# Patient Record
Sex: Female | Born: 1960 | Race: Black or African American | Hispanic: No | Marital: Single | Smoking: Current every day smoker
Health system: Southern US, Community
[De-identification: ages and names within clinical notes are randomized; demographics above are authoritative.]

## PROBLEM LIST (undated history)

## (undated) DIAGNOSIS — I10 Essential (primary) hypertension: Secondary | ICD-10-CM

## (undated) DIAGNOSIS — E785 Hyperlipidemia, unspecified: Secondary | ICD-10-CM

## (undated) HISTORY — PX: CHOLECYSTECTOMY: SHX55

## (undated) HISTORY — DX: Essential (primary) hypertension: I10

## (undated) HISTORY — DX: Hyperlipidemia, unspecified: E78.5

---

## 2008-04-02 ENCOUNTER — Emergency Department (HOSPITAL_COMMUNITY): Admission: EM | Admit: 2008-04-02 | Discharge: 2008-04-02 | Payer: Self-pay | Admitting: Emergency Medicine

## 2010-05-30 LAB — DIFFERENTIAL
Basophils Relative: 1 % (ref 0–1)
Lymphocytes Relative: 42 % (ref 12–46)
Lymphs Abs: 3 10*3/uL (ref 0.7–4.0)
Monocytes Absolute: 0.6 10*3/uL (ref 0.1–1.0)
Monocytes Relative: 9 % (ref 3–12)
Neutro Abs: 3.3 10*3/uL (ref 1.7–7.7)
Neutrophils Relative %: 46 % (ref 43–77)

## 2010-05-30 LAB — URINALYSIS, ROUTINE W REFLEX MICROSCOPIC
Glucose, UA: NEGATIVE mg/dL
Protein, ur: NEGATIVE mg/dL
Specific Gravity, Urine: 1.01 (ref 1.005–1.030)
Urobilinogen, UA: 0.2 mg/dL (ref 0.0–1.0)

## 2010-05-30 LAB — CBC
Hemoglobin: 10.5 g/dL — ABNORMAL LOW (ref 12.0–15.0)
RBC: 3.56 MIL/uL — ABNORMAL LOW (ref 3.87–5.11)
WBC: 7.2 10*3/uL (ref 4.0–10.5)

## 2010-05-30 LAB — BASIC METABOLIC PANEL
Calcium: 8.9 mg/dL (ref 8.4–10.5)
Creatinine, Ser: 0.76 mg/dL (ref 0.4–1.2)
GFR calc Af Amer: >60 mL/min (ref >60)
Sodium: 145 mEq/L (ref 135–145)

## 2010-11-24 ENCOUNTER — Encounter: Payer: Self-pay | Admitting: Family Medicine

## 2010-11-24 ENCOUNTER — Ambulatory Visit (INDEPENDENT_AMBULATORY_CARE_PROVIDER_SITE_OTHER): Payer: BC Managed Care – PPO | Admitting: Family Medicine

## 2010-11-24 VITALS — BP 140/80 | HR 90 | Resp 16 | Ht 66.0 in | Wt 254.1 lb

## 2010-11-24 DIAGNOSIS — R21 Rash and other nonspecific skin eruption: Secondary | ICD-10-CM

## 2010-11-24 DIAGNOSIS — E119 Type 2 diabetes mellitus without complications: Secondary | ICD-10-CM

## 2010-11-24 DIAGNOSIS — I10 Essential (primary) hypertension: Secondary | ICD-10-CM | POA: Insufficient documentation

## 2010-11-24 DIAGNOSIS — R252 Cramp and spasm: Secondary | ICD-10-CM

## 2010-11-24 DIAGNOSIS — IMO0002 Reserved for concepts with insufficient information to code with codable children: Secondary | ICD-10-CM | POA: Insufficient documentation

## 2010-11-24 DIAGNOSIS — E785 Hyperlipidemia, unspecified: Secondary | ICD-10-CM

## 2010-11-24 DIAGNOSIS — E1165 Type 2 diabetes mellitus with hyperglycemia: Secondary | ICD-10-CM | POA: Insufficient documentation

## 2010-11-24 DIAGNOSIS — E669 Obesity, unspecified: Secondary | ICD-10-CM

## 2010-11-24 MED ORDER — LISINOPRIL-HYDROCHLOROTHIAZIDE 10-12.5 MG PO TABS: 1.0000 | ORAL_TABLET | Freq: Every day | ORAL | Status: DC

## 2010-11-24 MED ORDER — INSULIN ASPART 100 UNIT/ML ~~LOC~~ SOLN: 5.0000 [IU] | Freq: Three times a day (TID) | SUBCUTANEOUS | Status: DC

## 2010-11-24 MED ORDER — INSULIN ASPART PROT & ASPART (70-30 MIX) 100 UNIT/ML ~~LOC~~ SUSP: SUBCUTANEOUS | Status: DC

## 2010-11-24 MED ORDER — TRIAMCINOLONE ACETONIDE 0.1 % EX OINT: TOPICAL_OINTMENT | Freq: Two times a day (BID) | CUTANEOUS | Status: DC

## 2010-11-24 NOTE — Patient Instructions (Signed)
Take 15  Units of 70/30 insulin twice a day Continue the short acting insulin at bedtime Continue your blood pressure pill Please get your lab-work done Follow-up in 2 weeks.

## 2010-11-24 NOTE — Progress Notes (Signed)
  Subjective:    Patient ID: Erin Lambert, female    DOB: 1960/05/05, 50 y.o.   MRN: [id number]  HPI pt has not seen a primary doctor  1. Diabetes Mellitus- diagnosed 2 years ago, has not seen a PCP in 4 years, when diagnosed she was found in [place] and taken to [facility] hospital- stay was approx 5 days, started on insulin then. Since then has been to urgent cares to have insulin refilled. Has not had an A1C, lipid  Last seen at urgent care 6 weeks ago. She does not take her insulin on a regular basis. Does not have the best diet CBG fasting- 180-200's, no hypoglycemic episodes, states her CBG have worsened over past few months Works night shift  2. Has a rash on arms/legs as the season changes, this has happened since a child, she has used calamine before which dries this up, +pruritis    3. HTN- has been on HCTZ/lisinopril for 2 years, refills have come from urgent care. States at home systolic is low 100's, diastolic 95  4. ? Hyperlipidemia- no meds , told to exercise and diet in the past  Lives in [place] for Mammogram, PAP Smear Declines Flu shot  Review of Systems   GEN- denies fatigue, fever, weight loss,weakness, recent illness HEENT- denies eye drainage, change in vision, nasal discharge, CVS- denies chest pain, palpitations RESP- denies SOB, cough, wheeze ABD- denies N/V, change in stools, abd pain GU- denies dysuria, hematuria, dribbling, incontinence MSK- denies joint pain, muscle aches, injury, +leg cramps Neuro- denies headache, dizziness, syncope, seizure activity      Objective:   Physical Exam   GEN- NAD, alert and oriented x3 HEENT- PERRL, EOMI, non injected sclera, pink conjunctiva, MMM, oropharynx clear Neck- Supple, no thryomegaly CVS- RRR, no murmur RESP-CTAB EXT- No edema Pulses- Radial, DP- 2+ Skin- hyperpigamented macules- well healed on lower ext and bilat arms , erythematous slightly raised lesion on inner part of left  wrist       Assessment & Plan:

## 2010-11-26 ENCOUNTER — Encounter: Payer: Self-pay | Admitting: Family Medicine

## 2010-11-26 DIAGNOSIS — R21 Rash and other nonspecific skin eruption: Secondary | ICD-10-CM | POA: Insufficient documentation

## 2010-11-26 NOTE — Assessment & Plan Note (Signed)
Check A1C F/U 1 week to determine her diabetes regimine, explained how 70/30 mix should be taken twice a day

## 2010-11-26 NOTE — Assessment & Plan Note (Signed)
Check lytes, encourage fluids

## 2010-11-26 NOTE — Assessment & Plan Note (Signed)
Check lipid panel  

## 2010-11-26 NOTE — Assessment & Plan Note (Signed)
dermatitis of some sort, only has 1 active lesion now, will give trial of topical steroid

## 2010-11-26 NOTE — Assessment & Plan Note (Signed)
Continue current medication, check labs, goal < 130/80

## 2010-11-28 LAB — COMPREHENSIVE METABOLIC PANEL
ALT: 20 U/L (ref 0–35)
Albumin: 3.9 g/dL (ref 3.5–5.2)
CO2: 31 mEq/L (ref 19–32)
Chloride: 103 mEq/L (ref 96–112)
Potassium: 3.9 mEq/L (ref 3.5–5.3)
Sodium: 142 mEq/L (ref 135–145)
Total Bilirubin: 0.4 mg/dL (ref 0.3–1.2)
Total Protein: 6.9 g/dL (ref 6.0–8.3)

## 2010-11-28 LAB — CBC
HCT: 41.1 % (ref 36.0–46.0)
MCHC: 32.1 g/dL (ref 30.0–36.0)
Platelets: 263 10*3/uL (ref 150–400)
RDW: 13.4 % (ref 11.5–15.5)

## 2010-11-28 LAB — HEMOGLOBIN A1C: Mean Plasma Glucose: 292 mg/dL — ABNORMAL HIGH (ref <117)

## 2010-11-28 LAB — TSH: TSH: 6.005 u[IU]/mL — ABNORMAL HIGH (ref 0.350–4.500)

## 2010-11-28 LAB — LIPID PANEL
LDL Cholesterol: 140 mg/dL — ABNORMAL HIGH (ref 0–99)
Triglycerides: 74 mg/dL (ref <150)

## 2010-11-29 ENCOUNTER — Telehealth: Payer: Self-pay | Admitting: *Deleted

## 2010-11-29 ENCOUNTER — Other Ambulatory Visit: Payer: Self-pay | Admitting: Family Medicine

## 2010-11-29 LAB — T4, FREE: Free T4: 1.12 ng/dL (ref 0.80–1.80)

## 2010-11-29 NOTE — Telephone Encounter (Signed)
Labs added Called patient, left message

## 2010-11-29 NOTE — Telephone Encounter (Signed)
Message copied by Diamantina Monks on Wed Nov 29, 2010  9:59 AM ------      Message from: Milinda Antis F      Created: Tue Nov 28, 2010  1:55 PM       Please call the lab and add on a Free T4 and T3, she had a red tube drawn- reason hypothyroidism.      Also please call pt and tell her - the A1C was very high at 11.8%, we will discuss in detail at our next visit.      I want her to inject 20 units of 70/30 in the AM and 15 units at night. Please bring her meter or record or CBG to the next visit.

## 2010-11-29 NOTE — Telephone Encounter (Signed)
Message copied by Diamantina Monks on Wed Nov 29, 2010  9:45 AM ------      Message from: Milinda Antis F      Created: Tue Nov 28, 2010  1:55 PM       Please call the lab and add on a Free T4 and T3, she had a red tube drawn- reason hypothyroidism.      Also please call pt and tell her - the A1C was very high at 11.8%, we will discuss in detail at our next visit.      I want her to inject 20 units of 70/30 in the AM and 15 units at night. Please bring her meter or record or CBG to the next visit.

## 2010-11-29 NOTE — Telephone Encounter (Signed)
Patient aware of lab results.

## 2010-12-08 ENCOUNTER — Encounter: Payer: Self-pay | Admitting: Family Medicine

## 2010-12-08 ENCOUNTER — Ambulatory Visit (INDEPENDENT_AMBULATORY_CARE_PROVIDER_SITE_OTHER): Payer: BC Managed Care – PPO | Admitting: Family Medicine

## 2010-12-08 VITALS — BP 120/80 | HR 74 | Resp 16 | Ht 66.0 in | Wt 255.1 lb

## 2010-12-08 DIAGNOSIS — E119 Type 2 diabetes mellitus without complications: Secondary | ICD-10-CM

## 2010-12-08 DIAGNOSIS — E785 Hyperlipidemia, unspecified: Secondary | ICD-10-CM

## 2010-12-08 DIAGNOSIS — R252 Cramp and spasm: Secondary | ICD-10-CM

## 2010-12-08 DIAGNOSIS — I10 Essential (primary) hypertension: Secondary | ICD-10-CM

## 2010-12-08 MED ORDER — INSULIN GLARGINE 100 UNIT/ML ~~LOC~~ SOLN: SUBCUTANEOUS | Status: DC

## 2010-12-08 MED ORDER — CYCLOBENZAPRINE HCL 5 MG PO TABS: 5.0000 mg | ORAL_TABLET | Freq: Every evening | ORAL | Status: DC | PRN

## 2010-12-08 MED ORDER — SIMVASTATIN 20 MG PO TABS: 20.0000 mg | ORAL_TABLET | Freq: Every day | ORAL | Status: DC

## 2010-12-08 NOTE — Patient Instructions (Addendum)
Start the Lantus, take 20 units with your lunch meal- this is once day  Continue the 5 units with each meal of Novolog Continue the blood pressure pill Pick up Calicum(1200mg ) /Vitamin D (800IU) Keep hydrated  Take the muscle relaxant at bedtime as needed  Start the cholesterol pill - take the in the evening Start an exercise program- Next routine visit in 3 months

## 2010-12-08 NOTE — Progress Notes (Signed)
  Subjective:    Patient ID: Erin Lambert, female    DOB: 1960-12-14, 50 y.o.   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 - 6AM 90-200's during this time Take CBG around between 2-3pm 109-229, often only gives 1 shot in 24 hours  Gives herself the 1st shot between 2 and 4pm,   Shift is 5pm-5pm Eats dinner at work at [employer] herself 5 units of Novolog at 8pm with dinner (misses this dose a lot), 6am gives 5 units, 2pm with Lunch   Stomach swelling- gets heartburn, feels one side is larger than the other  Gets knots in legs in calf- goes to chiropracter, also has cramping at night, normal Lytes  HTN- blood pressure improved, tolerating meds  Labs reviewed  Review of Systems GEN- denies fatigue, fever, weight loss,weakness, recent illness CVS- denies chest pain, palpitations RESP- denies SOB, cough, wheeze ABD- denies N/V, change in stools, abd pain GU- denies dysuria, hematuria, dribbling, incontinence MSK- denies joint pain, +muscle aches,denies injury, +leg cramps    Objective:   Physical Exam GEN- NAD, alert and oriented x 3 CVS- RRR., no murmur RESP-CTAB ABD- NABS, soft, NT,ND, large pannus, no asymetry seen EXT- no lesions felt in legs,  No edema      Assessment & Plan:

## 2010-12-10 NOTE — Assessment & Plan Note (Signed)
Bp improved, continue current med

## 2010-12-10 NOTE — Assessment & Plan Note (Signed)
Muscle relaxant, pt also sees chiropracter which helps, I do not see any of the "knots" she is describing, hydration

## 2010-12-10 NOTE — Assessment & Plan Note (Signed)
Her diabetes will be very difficult to control based on her erractic schedule. She has not been compliant in the past. She often skips insulin if she feels her sugar is low. As she is not eating on a regular basis and does not want insulin at work.  Will switch to Lantus and continue short acting insulin She declines nutritionist

## 2010-12-10 NOTE — Assessment & Plan Note (Signed)
Start zocor

## 2011-03-02 ENCOUNTER — Ambulatory Visit: Payer: BC Managed Care – PPO | Admitting: Family Medicine

## 2011-03-08 ENCOUNTER — Ambulatory Visit: Payer: BC Managed Care – PPO | Admitting: Family Medicine

## 2011-04-03 ENCOUNTER — Encounter (HOSPITAL_COMMUNITY): Payer: Self-pay | Admitting: *Deleted

## 2011-04-03 ENCOUNTER — Emergency Department (HOSPITAL_COMMUNITY)
Admission: EM | Admit: 2011-04-03 | Discharge: 2011-04-03 | Disposition: A | Discharge status: Home / Self Care | Payer: BC Managed Care – PPO | Attending: Emergency Medicine | Admitting: Emergency Medicine

## 2011-04-03 ENCOUNTER — Emergency Department (HOSPITAL_COMMUNITY): Payer: BC Managed Care – PPO

## 2011-04-03 DIAGNOSIS — Z9889 Other specified postprocedural states: Secondary | ICD-10-CM | POA: Insufficient documentation

## 2011-04-03 DIAGNOSIS — Z87891 Personal history of nicotine dependence: Secondary | ICD-10-CM | POA: Insufficient documentation

## 2011-04-03 DIAGNOSIS — M549 Dorsalgia, unspecified: Secondary | ICD-10-CM | POA: Insufficient documentation

## 2011-04-03 DIAGNOSIS — E119 Type 2 diabetes mellitus without complications: Secondary | ICD-10-CM | POA: Insufficient documentation

## 2011-04-03 DIAGNOSIS — M25569 Pain in unspecified knee: Secondary | ICD-10-CM | POA: Insufficient documentation

## 2011-04-03 DIAGNOSIS — Z794 Long term (current) use of insulin: Secondary | ICD-10-CM | POA: Insufficient documentation

## 2011-04-03 DIAGNOSIS — X500XXA Overexertion from strenuous movement or load, initial encounter: Secondary | ICD-10-CM | POA: Insufficient documentation

## 2011-04-03 DIAGNOSIS — I10 Essential (primary) hypertension: Secondary | ICD-10-CM | POA: Insufficient documentation

## 2011-04-03 MED ORDER — KETOROLAC TROMETHAMINE 60 MG/2ML IM SOLN
60.0000 mg | Freq: Once | INTRAMUSCULAR | Status: AC
  Administered 2011-04-03: 60 mg via INTRAMUSCULAR
  Filled 2011-04-03: qty 2

## 2011-04-03 MED ORDER — HYDROCODONE-ACETAMINOPHEN 5-325 MG PO TABS: ORAL_TABLET | ORAL | Status: AC

## 2011-04-03 MED ORDER — NAPROXEN 500 MG PO TABS: 500.0000 mg | ORAL_TABLET | Freq: Two times a day (BID) | ORAL | Status: DC

## 2011-04-03 NOTE — ED Notes (Signed)
Pain rt leg since "popped" my knee .

## 2011-04-03 NOTE — ED Notes (Signed)
Pt states that she was at work a few weeks ago when she turned suddenly and heard her right knee "pop", pt has pain that extends from the thigh area down right knee and into right calf area, pt ambulatory with limp, pt also c/o lower back pain, pt states that she has been having problems with her calf and lower back and has been seeing a chiropractor but states that it has gotten worse since the injury a few weeks ago, cms intact all extremities

## 2011-04-03 NOTE — Discharge Instructions (Signed)
Knee Pain °Knee pain can be a result of an injury or other medical conditions. Treatment will depend on the cause of your pain. °HOME CARE °· Only take medicine as told by your doctor.  °· Keep a healthy weight. Being overweight can make the knee hurt more.  °· Stretch before exercising or playing sports.  °· If there is constant knee pain, change the way you exercise. Ask your doctor for advice.  °· Make sure shoes fit well. Choose the right shoe for the sport or activity.  °· Protect your knees. Wear kneepads if needed.  °· Rest when you are tired.  °GET HELP RIGHT AWAY IF:  °· Your knee pain does not stop.  °· Your knee pain does not get better.  °· Your knee joint feels hot to the touch.  °· You have a temperature by mouth above 102° F (38.9° C), not controlled by medicine.  °· Your baby is older than 3 months with a rectal temperature of 102° F (38.9° C) or higher.  °· Your baby is 3 months old or younger with a rectal temperature of 100.4° F (38° C) or higher.  °MAKE SURE YOU:  °· Understand these instructions.  °· Will watch this condition.  °· Will get help right away if you are not doing well or get worse.  °Document Released: 04/27/2008 Document Revised: 10/11/2010 Document Reviewed: 04/27/2008 °Exit[employer]® Patient Information ©2012 [employer]. °

## 2011-04-03 NOTE — ED Provider Notes (Signed)
History     CSN: [id number]  Arrival date & time 04/03/11  1346   First MD Initiated Contact with Patient 04/03/11 1522      Chief Complaint  Patient presents with  . Leg Pain    (Consider location/radiation/quality/duration/timing/severity/associated sxs/prior treatment) HPI Comments: Patient c/o persistent pain to the right knee for several weeks since she twisted her knee at work.  Stats she felt a "pop"  At onset. PAin is worse with walking and improves somewhat with rest.  She also c/o pain to her right lower back that radiates into her lateral thigh and right lower leg.  Describes as sharp and "shooting" pain.  She denies incontinence, numbness or dysuria.  States she has been seeing a Land for same   Patient is a 51 y.o. female presenting with knee pain and back pain. The history is provided by the patient. No language interpreter was used.  Knee Pain This is a new problem. The current episode started 1 to 4 weeks ago. The problem occurs constantly. The problem has been unchanged. Associated symptoms include arthralgias. Pertinent negatives include no abdominal pain, chest pain, fever, headaches, joint swelling, neck pain, numbness, vomiting or weakness. The symptoms are aggravated by standing, twisting and walking. She has tried NSAIDs for the symptoms. The treatment provided no relief.  Back Pain  This is a recurrent problem. The current episode started more than 1 week ago. The problem occurs constantly. The problem has not changed since onset.The pain is associated with twisting. The pain is present in the lumbar spine. The quality of the pain is described as aching and shooting. The pain radiates to the right thigh. The pain is moderate. The symptoms are aggravated by bending, twisting and certain positions. The pain is the same all the time. Associated symptoms include leg pain. Pertinent negatives include no chest pain, no fever, no numbness, no headaches, no abdominal  pain, no abdominal swelling, no bowel incontinence, no perianal numbness, no bladder incontinence, no dysuria, no pelvic pain, no paresthesias, no paresis, no tingling and no weakness. She has tried NSAIDs for the symptoms. The treatment provided mild relief.    Past Medical History  Diagnosis Date  . Diabetes mellitus   . Hypertension     Past Surgical History  Procedure Date  . Cholecystectomy     Family History  Problem Relation Age of Onset  . Hypertension Mother   . Heart disease Mother   . Diabetes Father   . Kidney disease Father   . Heart disease Father   . Diabetes Brother     History  Substance Use Topics  . Smoking status: Former Smoker -- 1.0 packs/day for 25 years  . Smokeless tobacco: Not on file  . Alcohol Use: Yes     occasionally    OB History    Grav Para Term Preterm Abortions TAB SAB Ect Mult Living                  Review of Systems  Constitutional: Negative for fever, activity change and appetite change.  HENT: Negative for neck pain.   Respiratory: Negative for chest tightness and shortness of breath.   Cardiovascular: Negative for chest pain.  Gastrointestinal: Negative for vomiting, abdominal pain and bowel incontinence.  Genitourinary: Negative for bladder incontinence, dysuria, hematuria, decreased urine volume, difficulty urinating and pelvic pain.  Musculoskeletal: Positive for back pain and arthralgias. Negative for joint swelling.  Skin: Negative.   Neurological: Negative for dizziness, tingling, weakness,  numbness, headaches and paresthesias.  All other systems reviewed and are negative.    Allergies  Mushroom ext cmplx(shiitake-reishi-mait)  Home Medications   Current Outpatient Rx  Name Route Sig Dispense Refill  . CRANBERRY PO Oral Take 1 capsule by mouth daily.    . CYCLOBENZAPRINE HCL 5 MG PO TABS Oral Take 1 tablet (5 mg total) by mouth at bedtime as needed. 30 tablet 2  . OMEGA-3 FATTY ACIDS 1000 MG PO CAPS Oral Take  2 g by mouth daily.    . INSULIN ASPART 100 UNIT/ML [place] SOLN Subcutaneous Inject 5 Units into the skin 3 (three) times daily before meals. 10 mL 3  . INSULIN GLARGINE 100 UNIT/ML [place] SOLN  Inject 20 units daily 10 mL 6    Please dispense pen needles  . LISINOPRIL-HYDROCHLOROTHIAZIDE 10-12.5 MG PO TABS Oral Take 1 tablet by mouth daily. 30 tablet 3  . ADULT MULTIVITAMIN W/MINERALS CH Oral Take 1 tablet by mouth daily.    Marland Kitchen OLIVE LEAF PO Oral Take 1 capsule by mouth 3 (three) times daily with meals.    Marland Kitchen SIMVASTATIN 20 MG PO TABS Oral Take 20 mg by mouth every morning.      BP 158/84  Pulse 80  Temp(Src) 98.7 F (37.1 C) (Oral)  Resp 18  Ht 5\' 6"  (1.676 m)  Wt 250 lb (113.399 kg)  BMI 40.35 kg/m2  SpO2 100%  Physical Exam  Nursing note and vitals reviewed. Constitutional: She is oriented to person, place, and time. She appears well-developed and well-nourished. No distress.  HENT:  Head: Normocephalic and atraumatic.  Neck: Normal range of motion. Neck supple.  Cardiovascular: Normal rate, regular rhythm, normal heart sounds and intact distal pulses.   No murmur heard. Pulmonary/Chest: Effort normal and breath sounds normal. No respiratory distress.  Musculoskeletal: Normal range of motion. She exhibits tenderness. She exhibits no edema.       Right knee: She exhibits bony tenderness. She exhibits normal range of motion, no swelling, no effusion, no ecchymosis, no deformity, no erythema, no LCL laxity, normal patellar mobility and no MCL laxity. tenderness found. Lateral joint line tenderness noted. No patellar tendon tenderness noted.       Lumbar back: She exhibits tenderness. She exhibits normal range of motion, no bony tenderness, no swelling, no edema, no deformity, no laceration, no spasm and normal pulse.       Back:       Legs: Neurological: She is alert and oriented to person, place, and time. No cranial nerve deficit or sensory deficit. She exhibits normal muscle tone.  Coordination normal.  Reflex Scores:      Patellar reflexes are 2+ on the right side and 2+ on the left side.      Achilles reflexes are 2+ on the right side and 2+ on the left side. Skin: Skin is warm and dry.    ED Course  Procedures (including critical care time)  Labs Reviewed - No data to display Dg Knee Complete 4 Views Right  04/03/2011  *RADIOLOGY REPORT*  Clinical Data: Lateral right knee pain, crepitus/popping sensation approximately 2 weeks ago.  RIGHT KNEE - COMPLETE 4+ VIEW 04/03/2011:  Comparison: None.  Findings: No evidence of acute or subacute fracture or dislocation. Moderate medial compartment joint space narrowing with associated mild hypertrophic spurring.  Patellofemoral and lateral compartment joint spaces well preserved.  Minimal spurring along the undersurface of the patella.  Bone mineral density well preserved. Probable small joint effusion.  IMPRESSION: Moderate  medial compartment osteoarthritis and mild degenerative changes involving the undersurface of the patella.  No acute or subacute osseous abnormality.  Probable small joint effusion.  Original Report Authenticated By: Arnell Sieving, M.D.        Knee immob was applied by the nursing staff.  Pain improved , remains NV intact.    MDM    Patient has tenderness to palpation of the lateral right knee mild crepitus is present on exam. She has full range of motion of the knee.  No erythema, effusion, obvious ligament instability or step-offs are present on exam.  No calf pain, edema, or erythema.  She agrees to close followup with an orthopedist.      Pt feels improved after observation and/or treatment in ED. Patient / Family / Caregiver understand and agree with initial ED impression and plan with expectations set for ED visit. Pt stable in ED with no significant deterioration in condition.     Cherine Drumgoole L. Keymani Mclean, [place] 04/05/11 2050

## 2011-04-05 NOTE — ED Provider Notes (Signed)
Medical screening examination/treatment/procedure(s) were performed by non-physician practitioner and as supervising physician I was immediately available for consultation/collaboration.   Chezney Huether, MD 04/05/11 2356 

## 2011-05-30 ENCOUNTER — Other Ambulatory Visit: Payer: Self-pay | Admitting: Family Medicine

## 2011-06-17 ENCOUNTER — Other Ambulatory Visit: Payer: Self-pay | Admitting: Family Medicine

## 2011-11-29 ENCOUNTER — Ambulatory Visit (INDEPENDENT_AMBULATORY_CARE_PROVIDER_SITE_OTHER): Payer: BC Managed Care – PPO | Admitting: Family Medicine

## 2011-11-29 ENCOUNTER — Encounter: Payer: Self-pay | Admitting: Family Medicine

## 2011-11-29 VITALS — BP 150/98 | HR 80 | Resp 15 | Ht 66.0 in | Wt 270.1 lb

## 2011-11-29 DIAGNOSIS — IMO0001 Reserved for inherently not codable concepts without codable children: Secondary | ICD-10-CM

## 2011-11-29 DIAGNOSIS — E669 Obesity, unspecified: Secondary | ICD-10-CM

## 2011-11-29 DIAGNOSIS — E119 Type 2 diabetes mellitus without complications: Secondary | ICD-10-CM

## 2011-11-29 DIAGNOSIS — M549 Dorsalgia, unspecified: Secondary | ICD-10-CM

## 2011-11-29 DIAGNOSIS — E1165 Type 2 diabetes mellitus with hyperglycemia: Secondary | ICD-10-CM

## 2011-11-29 DIAGNOSIS — I10 Essential (primary) hypertension: Secondary | ICD-10-CM

## 2011-11-29 DIAGNOSIS — E785 Hyperlipidemia, unspecified: Secondary | ICD-10-CM

## 2011-11-29 DIAGNOSIS — IMO0002 Reserved for concepts with insufficient information to code with codable children: Secondary | ICD-10-CM

## 2011-11-29 MED ORDER — METFORMIN HCL ER 500 MG PO TB24: 500.0000 mg | ORAL_TABLET | Freq: Every day | ORAL | Status: DC

## 2011-11-29 MED ORDER — LISINOPRIL-HYDROCHLOROTHIAZIDE 10-12.5 MG PO TABS: 1.0000 | ORAL_TABLET | Freq: Every day | ORAL | Status: DC

## 2011-11-29 MED ORDER — NAPROXEN 500 MG PO TABS: 500.0000 mg | ORAL_TABLET | Freq: Two times a day (BID) | ORAL | Status: DC

## 2011-11-29 NOTE — Patient Instructions (Addendum)
Start lantus 20 units  Novolog with meals 5 units Metformin once a day Labs to be done Monday Naprosyn for back pain F/U 2 weeks

## 2011-11-29 NOTE — Progress Notes (Signed)
  Subjective:    Patient ID: Vidal Schwalbe, female    DOB: [birth year], 51 y.o.   MRN: [id number]  HPI  Pt here to f/u chronic medical problems she has not been seen in 1 year, she has not been taking medications on regular basis, she uses Novolog 5-10 units with meals, but not using Lantus. She is gaining 20 pounds since our last visit. She states that she is ready to change and take care of herself and has return for treatment. She also complains of chronic back pain worse in her lower back. She denies any radiation or change in bowel or bladder Review of Systems  GEN- denies fatigue, fever, weight loss,weakness, recent illness HEENT- denies eye drainage, change in vision, nasal discharge, CVS- denies chest pain, palpitations RESP- denies SOB, cough, wheeze ABD- denies N/V, change in stools, abd pain GU- denies dysuria, hematuria, dribbling, incontinence MSK- + joint pain, muscle aches, injury Neuro- denies headache, dizziness, syncope, seizure activity      Objective:   Physical Exam GEN- NAD, alert and oriented x3, obese HEENT- PERRL, EOMI, non injected sclera, pink conjunctiva, MMM, oropharynx clear Neck- Supple,  CVS- RRR, no murmur RESP-CTAB EXT- No edema Pulses- Radial, DP- 2+ Feet- Callus bilateral, thickened toenails, no open lesions       Assessment & Plan:

## 2011-12-02 DIAGNOSIS — M549 Dorsalgia, unspecified: Secondary | ICD-10-CM | POA: Insufficient documentation

## 2011-12-02 NOTE — Assessment & Plan Note (Signed)
Lantus 20 units, novolog 5 units QAC, obtain Fasting labs and A1C, on ACEI

## 2011-12-02 NOTE — Assessment & Plan Note (Signed)
Pt gaining weight, discussed diet and exercise

## 2011-12-02 NOTE — Assessment & Plan Note (Signed)
Not taking statin drug, check FLP then decide on dose of medication

## 2011-12-02 NOTE — Assessment & Plan Note (Signed)
Restart BP medications, check labs

## 2011-12-02 NOTE — Assessment & Plan Note (Signed)
MSK pain, NSAIDS given

## 2011-12-03 LAB — CBC
HCT: 42.3 % (ref 36.0–46.0)
Hemoglobin: 14 g/dL (ref 12.0–15.0)
WBC: 7.4 10*3/uL (ref 4.0–10.5)

## 2011-12-04 LAB — COMPREHENSIVE METABOLIC PANEL
BUN: 10 mg/dL (ref 6–23)
CO2: 30 mEq/L (ref 19–32)
Calcium: 9.5 mg/dL (ref 8.4–10.5)
Chloride: 106 mEq/L (ref 96–112)
Creat: 0.73 mg/dL (ref 0.50–1.10)

## 2011-12-04 LAB — LIPID PANEL
Cholesterol: 171 mg/dL (ref 0–200)
Triglycerides: 90 mg/dL (ref <150)
VLDL: 18 mg/dL (ref 0–40)

## 2011-12-04 LAB — HEMOGLOBIN A1C
Hgb A1c MFr Bld: 7 % — ABNORMAL HIGH (ref <5.7)
Mean Plasma Glucose: 154 mg/dL — ABNORMAL HIGH (ref <117)

## 2011-12-12 ENCOUNTER — Other Ambulatory Visit: Payer: Self-pay

## 2011-12-12 ENCOUNTER — Telehealth: Payer: Self-pay | Admitting: Family Medicine

## 2011-12-12 MED ORDER — INSULIN GLARGINE 100 UNIT/ML ~~LOC~~ SOLN: SUBCUTANEOUS | Status: DC

## 2011-12-12 NOTE — Telephone Encounter (Signed)
Med refilled.

## 2011-12-13 ENCOUNTER — Ambulatory Visit: Payer: BC Managed Care – PPO | Admitting: Family Medicine

## 2011-12-27 ENCOUNTER — Ambulatory Visit: Payer: BC Managed Care – PPO | Admitting: Family Medicine

## 2012-01-01 ENCOUNTER — Ambulatory Visit: Payer: BC Managed Care – PPO | Admitting: Family Medicine

## 2012-01-01 ENCOUNTER — Encounter: Payer: Self-pay | Admitting: Family Medicine

## 2012-01-28 ENCOUNTER — Telehealth: Payer: Self-pay | Admitting: Family Medicine

## 2012-01-28 ENCOUNTER — Other Ambulatory Visit: Payer: Self-pay | Admitting: Family Medicine

## 2012-01-28 NOTE — Telephone Encounter (Signed)
This was sent in today.  

## 2012-02-01 ENCOUNTER — Ambulatory Visit: Payer: BC Managed Care – PPO | Admitting: Family Medicine

## 2012-07-30 ENCOUNTER — Other Ambulatory Visit: Payer: Self-pay | Admitting: Family Medicine

## 2012-07-30 NOTE — Telephone Encounter (Signed)
Med refilled and pt aware needs ov

## 2012-08-08 ENCOUNTER — Ambulatory Visit: Payer: Self-pay | Admitting: Family Medicine

## 2012-10-24 ENCOUNTER — Ambulatory Visit (INDEPENDENT_AMBULATORY_CARE_PROVIDER_SITE_OTHER): Payer: BC Managed Care – PPO | Admitting: Family Medicine

## 2012-10-24 VITALS — BP 120/62 | HR 70 | Temp 97.5°F | Resp 18 | Wt 261.0 lb

## 2012-10-24 DIAGNOSIS — M25571 Pain in right ankle and joints of right foot: Secondary | ICD-10-CM

## 2012-10-24 DIAGNOSIS — M25579 Pain in unspecified ankle and joints of unspecified foot: Secondary | ICD-10-CM

## 2012-10-24 DIAGNOSIS — E785 Hyperlipidemia, unspecified: Secondary | ICD-10-CM

## 2012-10-24 DIAGNOSIS — E669 Obesity, unspecified: Secondary | ICD-10-CM

## 2012-10-24 DIAGNOSIS — I10 Essential (primary) hypertension: Secondary | ICD-10-CM

## 2012-10-24 DIAGNOSIS — E119 Type 2 diabetes mellitus without complications: Secondary | ICD-10-CM

## 2012-10-24 MED ORDER — METFORMIN HCL 1000 MG PO TABS: 1000.0000 mg | ORAL_TABLET | Freq: Two times a day (BID) | ORAL | Status: DC

## 2012-10-24 MED ORDER — NAPROXEN 500 MG PO TABS: 500.0000 mg | ORAL_TABLET | Freq: Two times a day (BID) | ORAL | Status: DC

## 2012-10-24 MED ORDER — LISINOPRIL-HYDROCHLOROTHIAZIDE 10-12.5 MG PO TABS: 1.0000 | ORAL_TABLET | Freq: Every day | ORAL | Status: DC

## 2012-10-24 NOTE — Patient Instructions (Addendum)
1/2 tablet at dinner for 3 days, then 1/2 tablet twice a day x 2 weeks Then increase to 1 tablet twice a day  Continue blood pressure medication I will about the blood work and what dose of simvastatin Keep checking blood sugar before meals  Stop the insulin  Take naprosyn twice a day for foot as needed F/U 6 weeks

## 2012-10-24 NOTE — Progress Notes (Signed)
  Subjective:    Patient ID: Erin Lambert, female    DOB: 13-Dec-1960, 52 y.o.   MRN: [id number]  HPI  Patient here to follow chronic medical problems. She's not been seen for approximately 11 months. She's been lost to followup. She has a history of diabetes mellitus which is been uncontrolled. She actually stopped her Lantus greater than 6 months ago because she did not think it was helping her blood sugars. She has also been out of her metformin and her cholesterol medication. The only medication she has been taking on a regular basis his NovoLog which she will sometimes give herself 5-10 units with her meals as well as her blood pressure pill. Her blood sugars have been running 150 to 180s when she does take them.  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 She states it feels similar to her previous tendinitis. She denies any particular injury.  Declines flu shot and pneumonia vaccine  Review of Systems  GEN- denies fatigue, fever, weight loss,weakness, recent illness HEENT- denies eye drainage, change in vision, nasal discharge, CVS- denies chest pain, palpitations RESP- denies SOB, cough, wheeze ABD- denies N/V, change in stools, abd pain GU- denies dysuria, hematuria, dribbling, incontinence MSK- + joint pain, muscle aches, injury Neuro- denies headache, dizziness, syncope, seizure activity      Objective:   Physical Exam GEN- NAD, alert and oriented x3, obese HEENT- PERRL, EOMI, non injected sclera, pink conjunctiva, MMM, oropharynx clear Neck- Supple,  CVS- RRR, no murmur RESP-CTAB EXT- No edema MSK- Normal inspection ankle and foot, FROM bilat, mild TTP latearl aspect right foot, no swelling noted Pulses- Radial, DP- 2+ Feet- Callus bilateral, thickened toenails, no open lesions       Assessment & Plan:

## 2012-10-25 LAB — CBC WITH DIFFERENTIAL/PLATELET
Basophils Absolute: 0 10*3/uL (ref 0.0–0.1)
Basophils Relative: 0 % (ref 0–1)
Eosinophils Absolute: 0.2 10*3/uL (ref 0.0–0.7)
MCH: 30 pg (ref 26.0–34.0)
MCHC: 33.2 g/dL (ref 30.0–36.0)
Neutrophils Relative %: 39 % — ABNORMAL LOW (ref 43–77)
Platelets: 191 10*3/uL (ref 150–400)
RBC: 4.64 MIL/uL (ref 3.87–5.11)

## 2012-10-25 LAB — COMPREHENSIVE METABOLIC PANEL
AST: 16 U/L (ref 0–37)
Albumin: 3.8 g/dL (ref 3.5–5.2)
Alkaline Phosphatase: 67 U/L (ref 39–117)
Glucose, Bld: 137 mg/dL — ABNORMAL HIGH (ref 70–99)
Potassium: 4 mEq/L (ref 3.5–5.3)
Sodium: 141 mEq/L (ref 135–145)
Total Protein: 7 g/dL (ref 6.0–8.3)

## 2012-10-25 LAB — LIPID PANEL: Cholesterol: 193 mg/dL (ref 0–200)

## 2012-10-25 LAB — MICROALBUMIN / CREATININE URINE RATIO: Microalb, Ur: 1.93 mg/dL — ABNORMAL HIGH (ref 0.00–1.89)

## 2012-10-26 ENCOUNTER — Encounter: Payer: Self-pay | Admitting: Family Medicine

## 2012-10-26 DIAGNOSIS — M25579 Pain in unspecified ankle and joints of unspecified foot: Secondary | ICD-10-CM | POA: Insufficient documentation

## 2012-10-26 MED ORDER — SIMVASTATIN 40 MG PO TABS: 40.0000 mg | ORAL_TABLET | Freq: Every day | ORAL | Status: DC

## 2012-10-26 NOTE — Assessment & Plan Note (Signed)
Tendinitis, naprosyn BID

## 2012-10-26 NOTE — Assessment & Plan Note (Signed)
Continue current meds, well controlled.  

## 2012-10-26 NOTE — Assessment & Plan Note (Signed)
Uncontrolled DM, she has not been taking insulin as directed I will try her on oral medications Start with metformin and titrate upward Stop novolog Urine micro Fasting labs

## 2012-10-26 NOTE — Assessment & Plan Note (Signed)
Discussed diet adherence, healthy lifestyle, routine followup

## 2012-10-26 NOTE — Assessment & Plan Note (Signed)
Check FLP and LFT and then decide on dose

## 2013-01-01 ENCOUNTER — Other Ambulatory Visit: Payer: Self-pay | Admitting: Family Medicine

## 2013-02-02 ENCOUNTER — Ambulatory Visit (INDEPENDENT_AMBULATORY_CARE_PROVIDER_SITE_OTHER): Payer: BC Managed Care – PPO | Admitting: Family Medicine

## 2013-02-02 ENCOUNTER — Encounter: Payer: Self-pay | Admitting: Family Medicine

## 2013-02-02 VITALS — BP 120/82 | HR 80 | Temp 98.6°F | Resp 18 | Ht 64.0 in | Wt 238.0 lb

## 2013-02-02 DIAGNOSIS — B354 Tinea corporis: Secondary | ICD-10-CM

## 2013-02-02 DIAGNOSIS — I10 Essential (primary) hypertension: Secondary | ICD-10-CM

## 2013-02-02 DIAGNOSIS — E119 Type 2 diabetes mellitus without complications: Secondary | ICD-10-CM

## 2013-02-02 DIAGNOSIS — E785 Hyperlipidemia, unspecified: Secondary | ICD-10-CM

## 2013-02-02 MED ORDER — CLOTRIMAZOLE-BETAMETHASONE 1-0.05 % EX CREA: 1.0000 "application " | TOPICAL_CREAM | Freq: Two times a day (BID) | CUTANEOUS | Status: DC

## 2013-02-02 MED ORDER — INSULIN ASPART 100 UNIT/ML ~~LOC~~ SOLN: 5.0000 [IU] | Freq: Three times a day (TID) | SUBCUTANEOUS | Status: DC

## 2013-02-02 MED ORDER — INSULIN GLARGINE 100 UNIT/ML SOLOSTAR PEN: PEN_INJECTOR | SUBCUTANEOUS | Status: DC

## 2013-02-02 NOTE — Progress Notes (Signed)
   Subjective:    Patient ID: Erin Lambert, female    DOB: 04/12/1960, 52 y.o.   MRN: [id number]  HPI  She'll followup chronic medical problems. She missed her 6 week followup from her previous visit. At that time she was started on metformin but states that she had an allergic reaction or she had dryness and peeling of her skin as well as her hair falling out therefore she stopped this medication. She went back to taking Lantus 20 units a couple of days a week and she would also take NovoLog 10 units with some meals. She was to consistently use of either types of insulin. She's not been checking her blood sugar on a regular basis and does not have any readings with her today. She does state that she wishes on the metformin her blood sugars were in the 300s.  Denies polyuria/polydipsia Recurrent rash on left foot- +pruritis, only petroleum applied   Review of Systems  GEN- denies fatigue, fever, weight loss,weakness, recent illness HEENT- denies eye drainage, change in vision, nasal discharge, CVS- denies chest pain, palpitations RESP- denies SOB, cough, wheeze ABD- denies N/V, change in stools, abd pain Neuro- denies headache, dizziness, syncope, seizure activity      Objective:   Physical Exam GEN- NAD, alert and oriented x3 HEENT- PERRL, EOMI, non injected sclera, pink conjunctiva, MMM, oropharynx clear CVS- RRR, no murmur RESP-CTAB EXT- No edema Skin- Left foot- quarter size circular lesion medal aspect of foot- raised with erythema and hyperpigmentation, no scaling noted.  Pulses- Radial, DP- 2+        Assessment & Plan:

## 2013-02-02 NOTE — Assessment & Plan Note (Signed)
The lesion on her foot looks more like a tinea lesion. I will give her Lotrisone twice a day

## 2013-02-02 NOTE — Assessment & Plan Note (Signed)
Blood pressure is well controlled 

## 2013-02-02 NOTE — Assessment & Plan Note (Signed)
Check direct LDL 

## 2013-02-02 NOTE — Patient Instructions (Signed)
Apply cream to foot twice a day Take Lantus 20 units once a day Novolog 5 units with meals Stop metformin We will call with lab results F/U 6 weeks

## 2013-02-02 NOTE — Assessment & Plan Note (Signed)
Adhesiolysis uncontrolled due to noncompliance with medications. He'll stop the metformin due to her probable side effects. I've advised her to a point of taking her insulin on a regular basis. She wants to go back to using her insulin therefore I will have her take Lantus 20 units every day. She will also use NovoLog 5 units with meals. She will bring her meter to the next visit. She will continue her ACE inhibitor and statin drug. She declines any immunizations

## 2013-02-03 LAB — LDL CHOLESTEROL, DIRECT: Direct LDL: 114 mg/dL — ABNORMAL HIGH

## 2013-02-03 LAB — BASIC METABOLIC PANEL
BUN: 10 mg/dL (ref 6–23)
Calcium: 9 mg/dL (ref 8.4–10.5)
Glucose, Bld: 270 mg/dL — ABNORMAL HIGH (ref 70–99)
Sodium: 137 mEq/L (ref 135–145)

## 2013-02-08 MED ORDER — ATORVASTATIN CALCIUM 20 MG PO TABS: 20.0000 mg | ORAL_TABLET | Freq: Every day | ORAL | Status: DC

## 2013-02-08 NOTE — Addendum Note (Signed)
Addended by: Milinda Antis F on: 02/08/2013 09:52 PM   Modules accepted: Orders, Medications

## 2013-02-09 ENCOUNTER — Ambulatory Visit: Payer: BC Managed Care – PPO | Admitting: Family Medicine

## 2013-02-20 ENCOUNTER — Telehealth: Payer: Self-pay | Admitting: *Deleted

## 2013-02-23 ENCOUNTER — Encounter: Payer: Self-pay | Admitting: *Deleted

## 2013-02-23 NOTE — Telephone Encounter (Signed)
This encounter was created in error - please disregard.

## 2013-02-23 NOTE — Telephone Encounter (Signed)
error 

## 2013-02-24 ENCOUNTER — Telehealth: Payer: Self-pay | Admitting: Family Medicine

## 2013-02-24 NOTE — Telephone Encounter (Signed)
Pt is needing a refill on all of her medications. Call back number is [phone]

## 2013-02-25 MED ORDER — NAPROXEN 500 MG PO TABS: ORAL_TABLET | ORAL | Status: DC

## 2013-02-25 MED ORDER — LISINOPRIL-HYDROCHLOROTHIAZIDE 10-12.5 MG PO TABS: 1.0000 | ORAL_TABLET | Freq: Every day | ORAL | Status: DC

## 2013-02-25 NOTE — Telephone Encounter (Signed)
Returned pts call and informed her of the medications that were already refilled with refills already on them, however,I am going to refill the ones that is needed.

## 2013-03-16 ENCOUNTER — Ambulatory Visit (INDEPENDENT_AMBULATORY_CARE_PROVIDER_SITE_OTHER): Payer: Managed Care, Other (non HMO) | Admitting: Family Medicine

## 2013-03-16 ENCOUNTER — Encounter: Payer: Self-pay | Admitting: Family Medicine

## 2013-03-16 VITALS — BP 130/80 | HR 98 | Temp 98.8°F | Resp 18 | Ht 63.5 in | Wt 238.0 lb

## 2013-03-16 DIAGNOSIS — E785 Hyperlipidemia, unspecified: Secondary | ICD-10-CM

## 2013-03-16 DIAGNOSIS — E119 Type 2 diabetes mellitus without complications: Secondary | ICD-10-CM

## 2013-03-16 MED ORDER — INSULIN GLARGINE 100 UNIT/ML SOLOSTAR PEN: PEN_INJECTOR | SUBCUTANEOUS | Status: DC

## 2013-03-16 MED ORDER — INSULIN ASPART 100 UNIT/ML FLEXPEN: 5.0000 [IU] | PEN_INJECTOR | Freq: Three times a day (TID) | SUBCUTANEOUS | Status: DC

## 2013-03-16 NOTE — Assessment & Plan Note (Signed)
Continue atorvastatin, plan to recheck in 8 weeks

## 2013-03-16 NOTE — Assessment & Plan Note (Addendum)
Restart insulin therapy, lantus 20 units Novolog 5 units with meals Given insulin from the office for both Recheck A1C in 8 weeks  She will also call me with her naturopathic medications Declines immunizations

## 2013-03-16 NOTE — Progress Notes (Signed)
   Subjective:    Patient ID: Erin Lambert, female    DOB: [birth year], 53 y.o.   MRN: [id number]  HPI  Patient here to followup diabetes mellitus. She was seen 6 weeks ago to reestablish care her A1c was 13.6% at that time. She was unable to tolerate metformin and therefore was continued on Lantus 20 units and NovoLog 5 units with each meal. She states that she actually ran out of insulin about 3 weeks ago and had difficulty contacting our office to get his refill though refills were sent the day of her appointment in the nurse actually called in as well so I'm not sure what actually happened. She was taking the NovoLog with each meal. She did not bring her meter or any readings with her today but states that they have been less than 200 fasting in a few episodes she did have hypoglycemia because she did not eat anything after taking her insulin.  She is being followed by a nitroglycerin tablet physician as well until see that she is on a couple of natural supplements as well as some type of the detox that she does not know the name of.   Review of Systems - per above     Objective:   Physical Exam  GEN-NAD, alert and oriented x 3       Assessment & Plan:

## 2013-03-16 NOTE — Patient Instructions (Signed)
Continue lantus 20 units Take novology 5 units with each meal Continue lipitor F/U 2 months for labs

## 2013-05-18 ENCOUNTER — Other Ambulatory Visit: Payer: Self-pay | Admitting: *Deleted

## 2013-05-18 MED ORDER — LISINOPRIL-HYDROCHLOROTHIAZIDE 10-12.5 MG PO TABS: 1.0000 | ORAL_TABLET | Freq: Every day | ORAL | Status: DC

## 2013-05-18 MED ORDER — ATORVASTATIN CALCIUM 20 MG PO TABS: 20.0000 mg | ORAL_TABLET | Freq: Every day | ORAL | Status: DC

## 2013-05-18 NOTE — Telephone Encounter (Signed)
Refill appropriate and filled per protocol. 

## 2013-06-08 ENCOUNTER — Other Ambulatory Visit: Payer: Self-pay | Admitting: *Deleted

## 2013-06-08 MED ORDER — INSULIN GLARGINE 100 UNIT/ML SOLOSTAR PEN: PEN_INJECTOR | SUBCUTANEOUS | Status: DC

## 2013-06-08 NOTE — Telephone Encounter (Signed)
Refill appropriate and filled per protocol. 

## 2013-06-26 ENCOUNTER — Encounter: Payer: Self-pay | Admitting: Family Medicine

## 2013-06-26 ENCOUNTER — Ambulatory Visit (INDEPENDENT_AMBULATORY_CARE_PROVIDER_SITE_OTHER): Payer: Managed Care, Other (non HMO) | Admitting: Family Medicine

## 2013-06-26 VITALS — BP 124/86 | HR 76 | Temp 99.0°F | Resp 18 | Wt 239.0 lb

## 2013-06-26 DIAGNOSIS — E785 Hyperlipidemia, unspecified: Secondary | ICD-10-CM

## 2013-06-26 DIAGNOSIS — G4700 Insomnia, unspecified: Secondary | ICD-10-CM

## 2013-06-26 DIAGNOSIS — Z566 Other physical and mental strain related to work: Secondary | ICD-10-CM | POA: Insufficient documentation

## 2013-06-26 DIAGNOSIS — L259 Unspecified contact dermatitis, unspecified cause: Secondary | ICD-10-CM

## 2013-06-26 DIAGNOSIS — L309 Dermatitis, unspecified: Secondary | ICD-10-CM | POA: Insufficient documentation

## 2013-06-26 DIAGNOSIS — Z569 Unspecified problems related to employment: Secondary | ICD-10-CM

## 2013-06-26 DIAGNOSIS — E119 Type 2 diabetes mellitus without complications: Secondary | ICD-10-CM

## 2013-06-26 DIAGNOSIS — E669 Obesity, unspecified: Secondary | ICD-10-CM

## 2013-06-26 DIAGNOSIS — I10 Essential (primary) hypertension: Secondary | ICD-10-CM

## 2013-06-26 LAB — CBC WITH DIFFERENTIAL/PLATELET
BASOS ABS: 0.1 10*3/uL (ref 0.0–0.1)
BASOS PCT: 1 % (ref 0–1)
EOS ABS: 0.3 10*3/uL (ref 0.0–0.7)
Eosinophils Relative: 3 % (ref 0–5)
HEMATOCRIT: 42.8 % (ref 36.0–46.0)
Hemoglobin: 14.5 g/dL (ref 12.0–15.0)
LYMPHS PCT: 52 % — AB (ref 12–46)
Lymphs Abs: 4.4 10*3/uL — ABNORMAL HIGH (ref 0.7–4.0)
MCH: 29.8 pg (ref 26.0–34.0)
MCHC: 33.9 g/dL (ref 30.0–36.0)
MCV: 87.9 fL (ref 78.0–100.0)
MONO ABS: 0.6 10*3/uL (ref 0.1–1.0)
Monocytes Relative: 7 % (ref 3–12)
Neutro Abs: 3.1 10*3/uL (ref 1.7–7.7)
Neutrophils Relative %: 37 % — ABNORMAL LOW (ref 43–77)
PLATELETS: 188 10*3/uL (ref 150–400)
RBC: 4.87 MIL/uL (ref 3.87–5.11)
RDW: 12.9 % (ref 11.5–15.5)
WBC: 8.5 10*3/uL (ref 4.0–10.5)

## 2013-06-26 LAB — COMPREHENSIVE METABOLIC PANEL
ALK PHOS: 66 U/L (ref 39–117)
ALT: 14 U/L (ref 0–35)
AST: 11 U/L (ref 0–37)
Albumin: 4.1 g/dL (ref 3.5–5.2)
BILIRUBIN TOTAL: 0.6 mg/dL (ref 0.2–1.2)
BUN: 18 mg/dL (ref 6–23)
CO2: 27 mEq/L (ref 19–32)
CREATININE: 0.76 mg/dL (ref 0.50–1.10)
Calcium: 10.1 mg/dL (ref 8.4–10.5)
Chloride: 98 mEq/L (ref 96–112)
GLUCOSE: 204 mg/dL — AB (ref 70–99)
Potassium: 4 mEq/L (ref 3.5–5.3)
SODIUM: 136 meq/L (ref 135–145)
TOTAL PROTEIN: 7 g/dL (ref 6.0–8.3)

## 2013-06-26 LAB — LIPID PANEL
CHOL/HDL RATIO: 2.9 ratio
CHOLESTEROL: 136 mg/dL (ref 0–200)
HDL: 47 mg/dL (ref >39)
LDL CALC: 73 mg/dL (ref 0–99)
Triglycerides: 82 mg/dL (ref <150)
VLDL: 16 mg/dL (ref 0–40)

## 2013-06-26 LAB — HEMOGLOBIN A1C, FINGERSTICK: Hgb A1C (fingerstick): >14 % — ABNORMAL HIGH (ref <5.7)

## 2013-06-26 MED ORDER — INSULIN GLARGINE 100 UNIT/ML SOLOSTAR PEN: PEN_INJECTOR | SUBCUTANEOUS | Status: DC

## 2013-06-26 MED ORDER — ATORVASTATIN CALCIUM 20 MG PO TABS: 20.0000 mg | ORAL_TABLET | Freq: Every day | ORAL | Status: DC

## 2013-06-26 MED ORDER — CLOTRIMAZOLE-BETAMETHASONE 1-0.05 % EX CREA: 1.0000 "application " | TOPICAL_CREAM | Freq: Two times a day (BID) | CUTANEOUS | Status: DC

## 2013-06-26 MED ORDER — INSULIN ASPART 100 UNIT/ML FLEXPEN: 5.0000 [IU] | PEN_INJECTOR | Freq: Three times a day (TID) | SUBCUTANEOUS | Status: DC

## 2013-06-26 MED ORDER — LISINOPRIL-HYDROCHLOROTHIAZIDE 10-12.5 MG PO TABS: 1.0000 | ORAL_TABLET | Freq: Every day | ORAL | Status: DC

## 2013-06-26 NOTE — Assessment & Plan Note (Addendum)
She has a Lotrisone cream at home already. I will have her try this twice a day. If not we will switch to clobetasol She will also look at the gloves she has at work Discussed avoiding chemical directly, hot water

## 2013-06-26 NOTE — Assessment & Plan Note (Signed)
One Lipitor recheck her lipid panel and adjust medications as needed goal to get LDL less than 100

## 2013-06-26 NOTE — Addendum Note (Signed)
Addended by: Milinda AntisURHAM, Yolette Hastings F on: 06/26/2013 01:40 PM   Modules accepted: Orders

## 2013-06-26 NOTE — Assessment & Plan Note (Addendum)
A1c in office was reading greater than 14% therefore it has been sent for a blood draw. She's reminded to try to check her blood sugars 3 times a day, unknown the meals are typical as she is working. Now we'll increase her Lantus to 30 units she will increase her NovoLog to 8 units. She has a sliding scale in the past I need her to be more consistent with checking her blood sugars with meals so that this could be dosed Acting ACE inhibitor and statin drug

## 2013-06-26 NOTE — Assessment & Plan Note (Signed)
We discussed stress and ways to handle sleep problems. Will have her  Try melatonin as she prefers natural medications

## 2013-06-26 NOTE — Assessment & Plan Note (Signed)
Blood pressure well controlled continue current medications 

## 2013-06-26 NOTE — Progress Notes (Signed)
Patient ID: Erin SchwalbeSharon A Lambert, female   DOB: March 08, 1960, 53 y.o.   MRN: [id number]   Subjective:    Patient ID: Erin SchwalbeSharon A Lambert, female    DOB: March 08, 1960, 53 y.o.   MRN: [id number]  Patient presents for c/o stress and fatigue  patient here to follow chronic medical problems. Diabetes mellitus she's not had any hypoglycemia she's been taking her Lantus 20 units as well as NovoLog 5 units. She does not have her meter with her today. CBG have been 200's when she takes them, typically 1-2 times a day Does have a lot of stress at work. She's been working 6 days in a row as well as alternating shifts. There's been a lot of stress on the job with many of the employees. She finds her felt fatigued all the time she's not sleeping as well. She's also hypertension and some mild headaches. She actually looked at the symptoms of stress and fatigue on the Internet.  Skin rash-she tends to break out the rash around her hands in the summertime. She noticed it when she wears gloves at work this is made it worse. She does not use anything on the rash    Review Of Systems:  GEN- denies fatigue, fever, weight loss,weakness, recent illness HEENT- denies eye drainage, change in vision, nasal discharge, CVS- denies chest pain, palpitations RESP- denies SOB, cough, wheeze ABD- denies N/V, change in stools, abd pain GU- denies dysuria, hematuria, dribbling, incontinence MSK- denies joint pain, muscle aches, injury Neuro- denies headache, dizziness, syncope, seizure activity       Objective:    BP 124/86  Pulse 76  Temp(Src) 99 F (37.2 C) (Oral)  Resp 18  Wt 239 lb (108.41 kg) GEN- NAD, alert and oriented x3 HEENT- PERRL, EOMI, non injected sclera, pink conjunctiva, MMM, oropharynx clear Neck- Supple, no thyromegaly CVS- RRR, no murmur RESP-CTAB EXT- No edema Pulses- Radial, DP- 2+ Feet- no open lesions, normal monofilament Psych- normal affect and mood Skin- erythematous scattered eczemaotus  lesions near left wrist, some flaky skin       Assessment & Plan:      Problem List Items Addressed This Visit   Type II or unspecified type diabetes mellitus without mention of complication, not stated as uncontrolled - Primary   Relevant Orders      CBC with Differential      Comprehensive metabolic panel      Hemoglobin A1C, fingerstick   Stress at work   Obesity   Insomnia   Hyperlipidemia   Relevant Orders      Lipid panel   Essential hypertension, benign   Dermatitis      Note: This dictation was prepared with Dragon dictation along with smaller Lobbyistphrase technology. Any transcriptional errors that result from this process are unintentional.

## 2013-06-26 NOTE — Patient Instructions (Addendum)
Try lotrisone cream twice a day Try Melatoin for  Vitamin B complex for energy Lantus- increase to 30 units  Novolog 8  units with each meal F/U 6 weeks

## 2013-06-26 NOTE — Assessment & Plan Note (Addendum)
No red flags, pt will look into taking some vacation time off Try B complex, Melatonin for sleep

## 2013-06-27 LAB — HEMOGLOBIN A1C
Hgb A1c MFr Bld: 13.1 % — ABNORMAL HIGH (ref <5.7)
Mean Plasma Glucose: 329 mg/dL — ABNORMAL HIGH (ref <117)

## 2013-08-07 ENCOUNTER — Ambulatory Visit: Payer: Managed Care, Other (non HMO) | Admitting: Family Medicine

## 2013-08-26 ENCOUNTER — Other Ambulatory Visit: Payer: Self-pay | Admitting: Family Medicine

## 2013-08-27 NOTE — Telephone Encounter (Signed)
Refill appropriate and filled per protocol. 

## 2013-09-11 ENCOUNTER — Ambulatory Visit: Payer: Managed Care, Other (non HMO) | Admitting: Family Medicine

## 2013-09-14 ENCOUNTER — Ambulatory Visit: Payer: Managed Care, Other (non HMO) | Admitting: Family Medicine

## 2014-02-01 ENCOUNTER — Other Ambulatory Visit: Payer: Self-pay | Admitting: *Deleted

## 2014-02-01 MED ORDER — INSULIN ASPART 100 UNIT/ML FLEXPEN: 8.0000 [IU] | PEN_INJECTOR | Freq: Three times a day (TID) | SUBCUTANEOUS | Status: DC

## 2014-02-01 NOTE — Telephone Encounter (Signed)
Received fax requesting refill on Novolog.   Refill appropriate and filled per protocol.  

## 2014-03-01 ENCOUNTER — Other Ambulatory Visit: Payer: Self-pay | Admitting: Family Medicine

## 2014-03-01 NOTE — Telephone Encounter (Signed)
Medication filled x1 with no refills.   Requires office visit before any further refills can be given.   Letter sent.  

## 2014-04-12 ENCOUNTER — Encounter: Payer: Self-pay | Admitting: Family Medicine

## 2014-05-04 ENCOUNTER — Other Ambulatory Visit: Payer: Self-pay | Admitting: Family Medicine

## 2014-05-05 NOTE — Telephone Encounter (Signed)
Medication refilled per protocol. 

## 2014-05-26 ENCOUNTER — Ambulatory Visit (INDEPENDENT_AMBULATORY_CARE_PROVIDER_SITE_OTHER): Payer: Managed Care, Other (non HMO) | Admitting: Family Medicine

## 2014-05-26 ENCOUNTER — Encounter: Payer: Self-pay | Admitting: Family Medicine

## 2014-05-26 VITALS — BP 136/76 | HR 76 | Temp 98.5°F | Resp 14 | Ht 64.0 in | Wt 230.0 lb

## 2014-05-26 DIAGNOSIS — R9431 Abnormal electrocardiogram [ECG] [EKG]: Secondary | ICD-10-CM | POA: Diagnosis not present

## 2014-05-26 DIAGNOSIS — Z1211 Encounter for screening for malignant neoplasm of colon: Secondary | ICD-10-CM | POA: Diagnosis not present

## 2014-05-26 DIAGNOSIS — IMO0002 Reserved for concepts with insufficient information to code with codable children: Secondary | ICD-10-CM

## 2014-05-26 DIAGNOSIS — E669 Obesity, unspecified: Secondary | ICD-10-CM | POA: Diagnosis not present

## 2014-05-26 DIAGNOSIS — Z1239 Encounter for other screening for malignant neoplasm of breast: Secondary | ICD-10-CM

## 2014-05-26 DIAGNOSIS — I10 Essential (primary) hypertension: Secondary | ICD-10-CM | POA: Diagnosis not present

## 2014-05-26 DIAGNOSIS — E1165 Type 2 diabetes mellitus with hyperglycemia: Secondary | ICD-10-CM

## 2014-05-26 DIAGNOSIS — E785 Hyperlipidemia, unspecified: Secondary | ICD-10-CM

## 2014-05-26 LAB — HEMOGLOBIN A1C, FINGERSTICK: HEMOGLOBIN A1C, FINGERSTICK: 7.7 % — AB (ref <5.7)

## 2014-05-26 MED ORDER — INSULIN GLARGINE 100 UNIT/ML SOLOSTAR PEN: PEN_INJECTOR | SUBCUTANEOUS | Status: DC

## 2014-05-26 MED ORDER — INSULIN ASPART 100 UNIT/ML FLEXPEN: 8.0000 [IU] | PEN_INJECTOR | Freq: Three times a day (TID) | SUBCUTANEOUS | Status: DC

## 2014-05-26 MED ORDER — ATORVASTATIN CALCIUM 20 MG PO TABS: 20.0000 mg | ORAL_TABLET | Freq: Every day | ORAL | Status: DC

## 2014-05-26 MED ORDER — LISINOPRIL-HYDROCHLOROTHIAZIDE 10-12.5 MG PO TABS: 1.0000 | ORAL_TABLET | Freq: Every day | ORAL | Status: DC

## 2014-05-26 MED ORDER — CLOTRIMAZOLE-BETAMETHASONE 1-0.05 % EX CREA: 1.0000 "application " | TOPICAL_CREAM | Freq: Two times a day (BID) | CUTANEOUS | Status: DC

## 2014-05-26 NOTE — Patient Instructions (Addendum)
Schedule your mammogram Colonoscopy to be scheduled for [place] Remember to take your cholesterol medication- take in the morning with blood pressure medication if you forget  2D Echo to be done to check your heart muscle Your A1C is 7.7%, which is great!, Continue with current dose of insulin  F/U 3 months

## 2014-05-26 NOTE — Assessment & Plan Note (Signed)
She asked about diet pills but I see no recent give her diet pills at this time as she is not compliant with some of her medications in regular follow-up Concerns about putting her on a medication without seeing any change in effort into her dietary component. If she continues to follow-up and we see some progress with her diet I will consider putting her on phentermine

## 2014-05-26 NOTE — Assessment & Plan Note (Signed)
Blood pressure is controlled today no change lisinopril HCTZ

## 2014-05-26 NOTE — Assessment & Plan Note (Addendum)
A1c today in the office is much improved, continue lantus 30 units, novolog 8-10 units with meals We are working on compliance with medications since she forgets to take in the evening on and a half or take her cholesterol blood pressure medicine first thing in the morning. She did have an appointment with the eye doctor recently I will obtain his records

## 2014-05-26 NOTE — Progress Notes (Signed)
Patient ID: Erin SchwalbeSharon A Folks, female   DOB: Apr 13, 1960, 54 y.o.   MRN: [id number]   Subjective:    Patient ID: Erin SchwalbeSharon A Lambert, female    DOB: Apr 13, 1960, 54 y.o.   MRN: [id number]  Patient presents for Medication Review/ Refills  patient to follow-up chronic medical problems. She's not been seen since May 2015 she's uncontrolled diabetes mellitus she is on Lantus 30 units and takes 8-10 units of NovoLog twice a day with meals. She typically does not eat 3 times a day. She denies any hypoglycemia symptoms however she has not checked her blood glucose in over a month as she does not have a meter, states that she lost it.  She forgets to take her blood pressure and cholesterol medication a few times a week. She is taking her supplements. She declines immunizations but agrees to get mammogram and colonoscopy today.  She did have a physical for her job and I have reviewed the labs are metabolic panel was normal with the exception of a mildly elevated ALT, her LDL was elevated at 120.    Review Of Systems:  GEN- denies fatigue, fever, weight loss,weakness, recent illness HEENT- denies eye drainage, change in vision, nasal discharge, CVS- denies chest pain, palpitations RESP- denies SOB, cough, wheeze ABD- denies N/V, change in stools, abd pain GU- denies dysuria, hematuria, dribbling, incontinence MSK- denies joint pain, muscle aches, injury Neuro- denies headache, dizziness, syncope, seizure activity       Objective:    BP 136/76 mmHg  Pulse 76  Temp(Src) 98.5 F (36.9 C) (Oral)  Resp 14  Ht 5\' 4"  (1.626 m)  Wt 230 lb (104.327 kg)  BMI 39.46 kg/m2 GEN- NAD, alert and oriented x3,Obese  HEENT- PERRL, EOMI, non injected sclera, pink conjunctiva, MMM, oropharynx clear Neck- Supple, no thyromegaly CVS- RRR, no murmur RESP-CTAB ABD-NABS,soft,NT,ND EXT- No edema Pulses- Radial, DP- 2+  EKG- NSR, poor r wave progression lateral leads      Assessment & Plan:       Problem List Items Addressed This Visit    Obesity    She asked about diet pills but I see no recent give her diet pills at this time as she is not compliant with some of her medications in regular follow-up Concerns about putting her on a medication without seeing any change in effort into her dietary component. If she continues to follow-up and we see some progress with her diet I will consider putting her on phentermine      Hyperlipidemia    I will not increase her statin drug as she is not taking on a regular basis.      Essential hypertension, benign    Blood pressure is controlled today no change lisinopril HCTZ      Diabetes mellitus type II, uncontrolled - Primary    A1c today in the office We are working on compliance with medications since she forgets to take in the evening on and a half or take her cholesterol blood pressure medicine first thing in the morning. She did have an appointment with the eye doctor recently I will obtain his records      Relevant Orders   Hemoglobin A1C, fingerstick   Microalbumin / creatinine urine ratio   HM DIABETES FOOT EXAM (Completed)    Other Visit Diagnoses    Nonspecific abnormal electrocardiogram (ECG) (EKG)        EKG from work NSR ,poor r wave, repeated in office unchanged, could indicate old injury,  will obtain 2D Echo    Relevant Orders    EKG 12-Lead (Completed)    Colon cancer screening        Relevant Orders    Ambulatory referral to Gastroenterology    Breast cancer screening        Relevant Orders    MM DIGITAL SCREENING BILATERAL       Note: This dictation was prepared with Dragon dictation along with smaller phrase technology. Any transcriptional errors that result from this process are unintentional.

## 2014-05-26 NOTE — Assessment & Plan Note (Signed)
I will not increase her statin drug as she is not taking on a regular basis.

## 2014-05-27 LAB — MICROALBUMIN / CREATININE URINE RATIO
Creatinine, Urine: 105.6 mg/dL
Microalb Creat Ratio: 4.7 mg/g (ref 0.0–30.0)
Microalb, Ur: 0.5 mg/dL (ref <2.0)

## 2014-05-31 ENCOUNTER — Telehealth: Payer: Self-pay | Admitting: *Deleted

## 2014-05-31 NOTE — Telephone Encounter (Signed)
pt has appt on Wednesday April 20th at 930am arrive 9:15a for 2D echo [facility] of [facility]. Lmtrc to pt for appt

## 2014-06-02 ENCOUNTER — Other Ambulatory Visit: Payer: Self-pay | Admitting: *Deleted

## 2014-06-02 ENCOUNTER — Ambulatory Visit (HOSPITAL_COMMUNITY): Payer: Managed Care, Other (non HMO) | Attending: Family Medicine

## 2014-06-02 MED ORDER — BLOOD GLUCOSE MONITOR SYSTEM W/DEVICE KIT: PACK | Status: DC

## 2014-06-02 MED ORDER — INSULIN PEN NEEDLE 31G X 8 MM MISC: Status: DC

## 2014-06-02 NOTE — Telephone Encounter (Signed)
lmtrc

## 2014-06-03 ENCOUNTER — Other Ambulatory Visit: Payer: Self-pay | Admitting: *Deleted

## 2014-06-03 MED ORDER — GLUCOSE BLOOD VI STRP: ORAL_STRIP | Status: DC

## 2014-06-03 NOTE — Telephone Encounter (Signed)
Received fax requesting refill on test strips.   Refill appropriate and filled per protocol. 

## 2014-06-07 ENCOUNTER — Encounter: Payer: Self-pay | Admitting: Family Medicine

## 2014-06-14 ENCOUNTER — Encounter: Payer: Self-pay | Admitting: *Deleted

## 2014-08-13 ENCOUNTER — Other Ambulatory Visit: Payer: Self-pay | Admitting: Family Medicine

## 2014-08-13 NOTE — Telephone Encounter (Signed)
Medication refilled per protocol. 

## 2014-08-27 ENCOUNTER — Ambulatory Visit: Payer: Managed Care, Other (non HMO) | Admitting: Family Medicine

## 2014-09-01 ENCOUNTER — Ambulatory Visit: Payer: Managed Care, Other (non HMO) | Admitting: Family Medicine

## 2014-11-19 ENCOUNTER — Other Ambulatory Visit: Payer: Self-pay | Admitting: Family Medicine

## 2014-11-19 ENCOUNTER — Encounter: Payer: Self-pay | Admitting: Family Medicine

## 2014-11-19 NOTE — Telephone Encounter (Signed)
Medication refill for one time only.  Patient needs to be seen.  Letter sent for patient to call and schedule 

## 2014-11-25 ENCOUNTER — Other Ambulatory Visit: Payer: Self-pay | Admitting: Family Medicine

## 2014-11-25 NOTE — Telephone Encounter (Signed)
Medication filled x1 with no refills.   Requires office visit before any further refills can be given.   Letter sent.  

## 2015-01-01 ENCOUNTER — Other Ambulatory Visit: Payer: Self-pay | Admitting: Family Medicine

## 2015-01-03 NOTE — Telephone Encounter (Signed)
Refill request denied.   Requires office visit before any further refills can be given.   Letter sent.  

## 2015-02-10 ENCOUNTER — Other Ambulatory Visit: Payer: Self-pay | Admitting: Family Medicine

## 2015-02-10 NOTE — Telephone Encounter (Signed)
Refill request denied.   Requires office visit before any further refills can be given.   Letter sent.

## 2015-02-21 ENCOUNTER — Encounter: Payer: Self-pay | Admitting: *Deleted

## 2015-02-21 ENCOUNTER — Telehealth: Payer: Self-pay | Admitting: *Deleted

## 2015-02-21 ENCOUNTER — Other Ambulatory Visit: Payer: Self-pay | Admitting: Family Medicine

## 2015-02-21 MED ORDER — INSULIN GLARGINE 100 UNIT/ML SOLOSTAR PEN: 30.0000 [IU] | PEN_INJECTOR | Freq: Every day | SUBCUTANEOUS | Status: DC

## 2015-02-21 NOTE — Telephone Encounter (Signed)
Received request from pharmacy for PA on Lantus.   Formulary alternatives include: Basaglar Levemir Evaristo Buryresiba  MD please advise.

## 2015-02-21 NOTE — Telephone Encounter (Signed)
Pt needs OV Change to Basaglar- advise her it is the same as Lantus, should be much cheaper Continue same dose, which I believe was 30 units

## 2015-02-21 NOTE — Telephone Encounter (Signed)
Prescription sent to pharmacy.   Letter sent.  

## 2015-03-11 ENCOUNTER — Ambulatory Visit (INDEPENDENT_AMBULATORY_CARE_PROVIDER_SITE_OTHER): Payer: Managed Care, Other (non HMO) | Admitting: Pediatrics

## 2015-03-11 ENCOUNTER — Encounter: Payer: Self-pay | Admitting: Pediatrics

## 2015-03-11 VITALS — BP 174/114 | HR 80 | Temp 98.7°F | Ht 64.0 in | Wt 280.6 lb

## 2015-03-11 DIAGNOSIS — E1165 Type 2 diabetes mellitus with hyperglycemia: Secondary | ICD-10-CM

## 2015-03-11 DIAGNOSIS — Z0000 Encounter for general adult medical examination without abnormal findings: Secondary | ICD-10-CM

## 2015-03-11 DIAGNOSIS — Z794 Long term (current) use of insulin: Secondary | ICD-10-CM | POA: Diagnosis not present

## 2015-03-11 DIAGNOSIS — E785 Hyperlipidemia, unspecified: Secondary | ICD-10-CM

## 2015-03-11 DIAGNOSIS — Z1211 Encounter for screening for malignant neoplasm of colon: Secondary | ICD-10-CM

## 2015-03-11 DIAGNOSIS — IMO0001 Reserved for inherently not codable concepts without codable children: Secondary | ICD-10-CM

## 2015-03-11 DIAGNOSIS — Z6841 Body Mass Index (BMI) 40.0 and over, adult: Secondary | ICD-10-CM

## 2015-03-11 DIAGNOSIS — I10 Essential (primary) hypertension: Secondary | ICD-10-CM

## 2015-03-11 LAB — POCT GLYCOSYLATED HEMOGLOBIN (HGB A1C): HEMOGLOBIN A1C: 9.5

## 2015-03-11 MED ORDER — LISINOPRIL-HYDROCHLOROTHIAZIDE 20-25 MG PO TABS: 1.0000 | ORAL_TABLET | Freq: Every day | ORAL | Status: DC

## 2015-03-11 NOTE — Progress Notes (Signed)
Subjective:    Patient ID: Erin Lambert, female    DOB: 12-25-1960, 55 y.o.   MRN: [id number]  CC: New Patient (Initial Visit) and follow up multiple med problems  HPI: Erin Lambert is a 55 y.o. female presenting for New Patient (Initial Visit)  Had episode of chest pain when sleeping 2 months ago after eating something right before bed, woke up, felt like heaviness in chest. Lasted 10-15 minutes, went away on its on. Felt like her indigestion, lasted a few minutes longer than usual. Not every time she eats, but does happen apprx 2 times a week, asosciated with eating then goes away.  Never has chest pain with exertion. Works in Office manager, moving a lot at work.  HTN: Missed medicine yesterday. Took today right before she came here. Usually takes one pill a day, took two this morning. No HA, no blurry vision, no chest pain now. Two days ago at work had some blurry vision.  Pap smear: 2 weeks ago, at Dr. Fannie Lambert in [place]  DM2: Most recent HgA1c per pt was 7. In EMR can only see a 13.1 taken almost two years ago. Says BGLs at home mostly under 200s. On SSI with 70-30, takes 8 units with most meals. Takes lantus 30-34 units once every 24h. Misses apprx once a week.  Last saw eye doctor 08/2014, in [place], [place] sure who the eye doctor is  BMI 48: says weight has been 260s-280s for last few years. Says the weights in system that are 250 or less are wrong.   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   Depression screen [facility] 2/9 03/11/2015 06/26/2013  Decreased Interest 0 2  Down, Depressed, Hopeless 0 2  PHQ - 2 Score 0 4  Altered sleeping - 1  Tired, decreased energy - 3  Change in appetite - 0  Feeling bad or failure about yourself  - 0  Trouble concentrating - 0  Moving slowly or fidgety/restless - 0  Suicidal thoughts - 0  PHQ-9 Score - 8      ROS: All systems negative other than what is in HPI   Past Medical  History Patient Active Problem List   Diagnosis Date Noted  . BMI 45.0-49.9, adult ([place]) 03/11/2015  . Stress at work 06/26/2013  . Insomnia 06/26/2013  . Dermatitis 06/26/2013  . Tinea corporis 02/02/2013  . Pain in joint, ankle and foot 10/26/2012  . Back pain 12/02/2011  . Diabetes mellitus type II, uncontrolled ([place]) 11/24/2010  . Essential hypertension, benign 11/24/2010  . Hyperlipidemia 11/24/2010  . Obesity 11/24/2010  . Leg cramps 11/24/2010   Social History   Social History  . Marital Status: Single    Spouse Name: N/A  . Number of Children: N/A  . Years of Education: N/A   Occupational History  . Not on file.   Social History Main Topics  . Smoking status: Current Every Day Smoker -- 0.50 packs/day for 25 years    Types: Cigarettes  . Smokeless tobacco: Not on file  . Alcohol Use: 0.0 oz/week    0 Standard drinks or equivalent per week     Comment: occasionally  . Drug Use: No  . Sexual Activity: Not Currently   Other Topics Concern  . Not on file   Social History Narrative   Family History  Problem Relation Age of Onset  . Hypertension Mother   . Heart disease Mother   .  Diabetes Father   . Kidney disease Father   . Heart disease Father   . Diabetes Brother      Current Outpatient Prescriptions  Medication Sig Dispense Refill  . atorvastatin (LIPITOR) 20 MG tablet Take 1 tablet (20 mg total) by mouth at bedtime. 90 tablet 2  . Blood Glucose Monitoring Suppl (BLOOD GLUCOSE MONITOR SYSTEM) W/DEVICE KIT Use to monitor FSBS 4x daily as directed for fluctuating CBG's. Dx: E11.65. 1 each 0  . clotrimazole-betamethasone (LOTRISONE) cream Apply 1 application topically 2 (two) times daily. 45 g 2  . glucose blood test strip Use to monitor FSBS 4x daily as directed for fluctuating CBG's. Dx: E11.65. 400 each 3  . Insulin Glargine (BASAGLAR KWIKPEN) 100 UNIT/ML Solostar Pen Inject 30 Units into the skin daily at 10 pm. 15 mL 11  . Insulin Pen Needle 31G X  8 MM MISC Use to inject insulin into skin 4x daily as directed. 200 each 11  . Multiple Vitamin (MULITIVITAMIN WITH MINERALS) TABS Take 1 tablet by mouth daily.    . naproxen (NAPROSYN) 500 MG tablet TAKE 1 TABLET BY MOUTH TWICE A DAY WITH FOOD AS NEEDED FOR PAIN 60 tablet 1  . insulin NPH-regular Human (NOVOLIN 70/30) (70-30) 100 UNIT/ML injection Inject into the skin.    Marland Kitchen lisinopril-hydrochlorothiazide (PRINZIDE,ZESTORETIC) 20-25 MG tablet Take 1 tablet by mouth daily. 30 tablet 1   No current facility-administered medications for this visit.       Objective:    BP 174/114 mmHg  Pulse 80  Temp(Src) 98.7 F (37.1 C) (Oral)  Ht 5' 4"  (1.626 m)  Wt 280 lb 9.6 oz (127.279 kg)  BMI 48.14 kg/m2  Wt Readings from Last 3 Encounters:  03/11/15 280 lb 9.6 oz (127.279 kg)  05/26/14 230 lb (104.327 kg)  06/26/13 239 lb (108.41 kg)     Gen: NAD, alert, cooperative with exam, NCAT EYES: EOMI, no scleral injection or icterus ENT:  TMs pearly gray b/l, OP without erythema LYMPH: no cervical LAD CV: NRRR, normal S1/S2, no murmur, distal pulses 2+ b/l Resp: CTABL, no wheezes, normal WOB Abd: +BS, soft, NTND.  Ext: No edema, warm Neuro: Alert and oriented MSK: normal muscle bulk     Assessment & Plan:    Erin Lambert was seen today for follow up multiple med problems.  Diagnoses and all orders for this visit:  Encounter for preventive health examination -     POCT glycosylated hemoglobin (Hb A1C) -     CMP14+EGFR -     CBC -     Lipid panel -     Hepatitis C antibody -     MM Digital Screening; Future -     Ambulatory referral to Gastroenterology for colon ca screening  Essential hypertension, benign Elevated today, increase Bp meds, take BP regularly at home, let me know if still elevated, f/u in 2 weeks. May need additional medicine. -     CMP14+EGFR -     lisinopril-hydrochlorothiazide (PRINZIDE,ZESTORETIC) 20-25 MG tablet; Take 1 tablet by mouth daily.  Uncontrolled type 2  diabetes mellitus without complication, with long-term current use of insulin ([place]) Continue current medicines, write down BGLs from home, bring to next clinic visit.  -     POCT glycosylated hemoglobin (Hb A1C) -     Microalbumin/Creatinine Ratio, Urine  Hyperlipidemia -     Lipid panel  BMI 45.0-49.9, adult ([facility]) -     Lipid panel  Colon cancer screening -  Ambulatory referral to Gastroenterology  Pap smear 2 weeks ago, in [place] Dr Erin Lambert  Follow up plan: Return in about 2 weeks (around 03/25/2015) for DM2 and BP follow up.  Assunta Found, MD [facility] 03/11/2015, 10:03 AM

## 2015-03-11 NOTE — Patient Instructions (Signed)
Check blood pressures at home over next two weeks  Bring blood pressure and glucose numbers back to clinic visit in 2 weeks.

## 2015-03-12 LAB — CBC
HEMATOCRIT: 41.4 % (ref 34.0–46.6)
Hemoglobin: 13.6 g/dL (ref 11.1–15.9)
MCH: 29.6 pg (ref 26.6–33.0)
MCHC: 32.9 g/dL (ref 31.5–35.7)
MCV: 90 fL (ref 79–97)
PLATELETS: 199 10*3/uL (ref 150–379)
RBC: 4.59 x10E6/uL (ref 3.77–5.28)
RDW: 13.8 % (ref 12.3–15.4)
WBC: 8 10*3/uL (ref 3.4–10.8)

## 2015-03-12 LAB — CMP14+EGFR
ALBUMIN: 4 g/dL (ref 3.5–5.5)
ALK PHOS: 76 IU/L (ref 39–117)
ALT: 26 IU/L (ref 0–32)
AST: 17 IU/L (ref 0–40)
Albumin/Globulin Ratio: 1.3 (ref 1.1–2.5)
BILIRUBIN TOTAL: 0.4 mg/dL (ref 0.0–1.2)
BUN / CREAT RATIO: 16 (ref 9–23)
BUN: 12 mg/dL (ref 6–24)
CO2: 26 mmol/L (ref 18–29)
CREATININE: 0.77 mg/dL (ref 0.57–1.00)
Calcium: 9.6 mg/dL (ref 8.7–10.2)
Chloride: 102 mmol/L (ref 96–106)
GFR calc non Af Amer: 88 mL/min/{1.73_m2} (ref >59)
GFR, EST AFRICAN AMERICAN: 102 mL/min/{1.73_m2} (ref >59)
GLOBULIN, TOTAL: 3.1 g/dL (ref 1.5–4.5)
Glucose: 57 mg/dL — ABNORMAL LOW (ref 65–99)
Potassium: 3.9 mmol/L (ref 3.5–5.2)
SODIUM: 143 mmol/L (ref 134–144)
TOTAL PROTEIN: 7.1 g/dL (ref 6.0–8.5)

## 2015-03-12 LAB — MICROALBUMIN / CREATININE URINE RATIO: CREATININE, UR: 84.9 mg/dL

## 2015-03-12 LAB — LIPID PANEL
CHOL/HDL RATIO: 2.9 ratio (ref 0.0–4.4)
Cholesterol, Total: 148 mg/dL (ref 100–199)
HDL: 51 mg/dL (ref >39)
LDL Calculated: 81 mg/dL (ref 0–99)
Triglycerides: 82 mg/dL (ref 0–149)
VLDL Cholesterol Cal: 16 mg/dL (ref 5–40)

## 2015-03-12 LAB — HEPATITIS C ANTIBODY

## 2015-03-24 ENCOUNTER — Encounter: Payer: Self-pay | Admitting: *Deleted

## 2015-03-25 ENCOUNTER — Ambulatory Visit (INDEPENDENT_AMBULATORY_CARE_PROVIDER_SITE_OTHER): Payer: Managed Care, Other (non HMO) | Admitting: Pediatrics

## 2015-03-25 ENCOUNTER — Encounter: Payer: Self-pay | Admitting: Pediatrics

## 2015-03-25 VITALS — BP 138/100 | HR 94 | Temp 97.5°F | Ht 64.0 in | Wt 276.6 lb

## 2015-03-25 DIAGNOSIS — Z794 Long term (current) use of insulin: Secondary | ICD-10-CM | POA: Diagnosis not present

## 2015-03-25 DIAGNOSIS — Z1239 Encounter for other screening for malignant neoplasm of breast: Secondary | ICD-10-CM

## 2015-03-25 DIAGNOSIS — E785 Hyperlipidemia, unspecified: Secondary | ICD-10-CM | POA: Diagnosis not present

## 2015-03-25 DIAGNOSIS — I10 Essential (primary) hypertension: Secondary | ICD-10-CM

## 2015-03-25 DIAGNOSIS — L259 Unspecified contact dermatitis, unspecified cause: Secondary | ICD-10-CM

## 2015-03-25 DIAGNOSIS — E1165 Type 2 diabetes mellitus with hyperglycemia: Secondary | ICD-10-CM | POA: Diagnosis not present

## 2015-03-25 DIAGNOSIS — IMO0001 Reserved for inherently not codable concepts without codable children: Secondary | ICD-10-CM

## 2015-03-25 MED ORDER — TRIAMCINOLONE ACETONIDE 0.5 % EX OINT: 1.0000 "application " | TOPICAL_OINTMENT | Freq: Two times a day (BID) | CUTANEOUS | Status: DC

## 2015-03-25 MED ORDER — HYDROCHLOROTHIAZIDE 25 MG PO TABS: 25.0000 mg | ORAL_TABLET | Freq: Every day | ORAL | Status: DC

## 2015-03-25 MED ORDER — LISINOPRIL 40 MG PO TABS: 40.0000 mg | ORAL_TABLET | Freq: Every day | ORAL | Status: DC

## 2015-03-25 MED ORDER — INSULIN GLARGINE 100 UNIT/ML SOLOSTAR PEN: 30.0000 [IU] | PEN_INJECTOR | Freq: Every day | SUBCUTANEOUS | Status: DC

## 2015-03-25 NOTE — Progress Notes (Signed)
Subjective:    Patient ID: Erin Lambert, female    DOB: November 07, 1960, 55 y.o.   MRN: [id number]  CC: Follow-up and Rash   HPI: MYLES MALLICOAT is a 55 y.o. female presenting for Follow-up and Rash  DM2: BGls between 150 and 200. Not checking BGLs regularly.  HTN: BPs at home 170s-180s /110s at first following last visit at home, down to 130s/100s at home over past 3-5 days  Recent trichomonas infection, s/p treatment several weeks ago. Says she hasnt recently had any new partners.  No HA, no chest pain.  Rash started on inside of R wrist where she wears wrist guard for work. Trying to put clothing between her wrist and the guard. Ongoing for several weeks. Very itchy.   Depression screen [facility] 2/9 03/25/2015 03/11/2015 06/26/2013  Decreased Interest 0 0 2  Down, Depressed, Hopeless 0 0 2  PHQ - 2 Score 0 0 4  Altered sleeping - - 1  Tired, decreased energy - - 3  Change in appetite - - 0  Feeling bad or failure about yourself  - - 0  Trouble concentrating - - 0  Moving slowly or fidgety/restless - - 0  Suicidal thoughts - - 0  PHQ-9 Score - - 8     Relevant past medical, surgical, family and social history reviewed and updated as indicated. Interim medical history since our last visit reviewed. Allergies and medications reviewed and updated.    ROS: Per HPI unless specifically indicated above  History  Smoking status  . Current Every Day Smoker -- 0.50 packs/day for 25 years  . Types: Cigarettes  Smokeless tobacco  . Not on file    Past Medical History Patient Active Problem List   Diagnosis Date Noted  . BMI 45.0-49.9, adult ([place]) 03/11/2015  . Stress at work 06/26/2013  . Insomnia 06/26/2013  . Dermatitis 06/26/2013  . Tinea corporis 02/02/2013  . Pain in joint, ankle and foot 10/26/2012  . Back pain 12/02/2011  . Diabetes mellitus type II, uncontrolled ([place]) 11/24/2010  . Essential hypertension, benign 11/24/2010  . Hyperlipidemia 11/24/2010    . Obesity 11/24/2010  . Leg cramps 11/24/2010    Current Outpatient Prescriptions  Medication Sig Dispense Refill  . atorvastatin (LIPITOR) 20 MG tablet Take 1 tablet (20 mg total) by mouth at bedtime. 90 tablet 2  . Blood Glucose Monitoring Suppl (BLOOD GLUCOSE MONITOR SYSTEM) W/DEVICE KIT Use to monitor FSBS 4x daily as directed for fluctuating CBG's. Dx: E11.65. 1 each 0  . glucose blood test strip Use to monitor FSBS 4x daily as directed for fluctuating CBG's. Dx: E11.65. 400 each 3  . Insulin Glargine (BASAGLAR KWIKPEN) 100 UNIT/ML Solostar Pen Inject 30 Units into the skin daily at 10 pm. 15 mL 11  . insulin NPH-regular Human (NOVOLIN 70/30) (70-30) 100 UNIT/ML injection Inject into the skin.    . Insulin Pen Needle 31G X 8 MM MISC Use to inject insulin into skin 4x daily as directed. 200 each 11  . Multiple Vitamin (MULITIVITAMIN WITH MINERALS) TABS Take 1 tablet by mouth daily.    . hydrochlorothiazide (HYDRODIURIL) 25 MG tablet Take 1 tablet (25 mg total) by mouth daily. 90 tablet 3  . lisinopril (PRINIVIL,ZESTRIL) 40 MG tablet Take 1 tablet (40 mg total) by mouth daily. 30 tablet 3  . naproxen (NAPROSYN) 500 MG tablet TAKE 1 TABLET BY MOUTH TWICE A DAY WITH FOOD AS NEEDED FOR PAIN (Patient not taking: Reported on 03/25/2015) 60  tablet 1  . triamcinolone ointment (KENALOG) 0.5 % Apply 1 application topically 2 (two) times daily. 30 g 0   No current facility-administered medications for this visit.       Objective:    BP 138/100 mmHg  Pulse 94  Temp(Src) 97.5 F (36.4 C) (Oral)  Ht _0  (1.626 m)  Wt 276 lb 9.6 oz (125.465 kg)  BMI 47.45 kg/m2  Wt Readings from Last 3 Encounters:  03/25/15 276 lb 9.6 oz (125.465 kg)  03/11/15 280 lb 9.6 oz (127.279 kg)  05/26/14 230 lb (104.327 kg)     Gen: NAD, alert, cooperative with exam, NCAT EYES: EOMI, no scleral injection or icterus ENT:  OP without erythema CV: NRRR, normal S1/S2, no murmur, distal pulses 2+ b/l Resp: CTABL,  no wheezes, normal WOB Abd: +BS, soft, NTND.  Ext: No edema, warm Neuro: Alert and oriented, strength equal b/l UE and LE, coordination grossly normal MSK: normal muscle bulk Skin: R wrist with lichenified plaque with some excoriations     Assessment & Plan:    Roniyah was seen today for follow-up med problems and rash.  Diagnoses and all orders for this visit:  Essential hypertension, benign Increase lisinopril to 15m daily. Recheck labs next visit. -     lisinopril (PRINIVIL,ZESTRIL) 40 MG tablet; Take 1 tablet (40 mg total) by mouth daily. -     hydrochlorothiazide (HYDRODIURIL) 25 MG tablet; Take 1 tablet (25 mg total) by mouth daily.  Uncontrolled type 2 diabetes mellitus without complication, with long-term current use of insulin (HCC) Uncontrolled. Continue insulin therapy. Pt wants to try to follow her DMII regimen prior to changing it, not regularly checking BGLs during day now to know how much SSI insulin to give. Will check more often, RTC with BGLs next visit. HgA1c elevated, now 9.5. -     Insulin Glargine (BASAGLAR KWIKPEN) 100 UNIT/ML Solostar Pen; Inject 30 Units into the skin daily at 10 pm.  Hyperlipidemia Continue statin  Contact dermatitis Wrap wrist before putting on wrist guard so skin with no contact with guard. -     triamcinolone ointment (KENALOG) 0.5 %; Apply 1 application topically 2 (two) times daily.  Screening for breast cancer -     MM Digital Screening; Future   Follow up plan: Return in about 4 weeks (around 04/22/2015) for recheck BP, DM2.  CAssunta Found MD [facility] 03/25/2015, 8:35 AM

## 2015-03-25 NOTE — Patient Instructions (Signed)
Check blood sugars before meals three times a day

## 2015-03-30 ENCOUNTER — Other Ambulatory Visit: Payer: Self-pay | Admitting: Pediatrics

## 2015-03-30 DIAGNOSIS — Z1231 Encounter for screening mammogram for malignant neoplasm of breast: Secondary | ICD-10-CM

## 2015-04-04 ENCOUNTER — Telehealth: Payer: Self-pay | Admitting: Pediatrics

## 2015-04-04 ENCOUNTER — Other Ambulatory Visit: Payer: Self-pay | Admitting: Family Medicine

## 2015-04-04 NOTE — Telephone Encounter (Signed)
Refill refused.   Patient should be taking Basaglar.   Also, patient has established care at [facility].

## 2015-04-04 NOTE — Telephone Encounter (Signed)
Pt had question regarding Basaglar Informed pt that it is replacing Lantus Pt verbalizes understanding

## 2015-04-11 ENCOUNTER — Ambulatory Visit (HOSPITAL_COMMUNITY): Payer: Managed Care, Other (non HMO)

## 2015-04-12 ENCOUNTER — Encounter: Payer: Self-pay | Admitting: Internal Medicine

## 2015-04-13 ENCOUNTER — Ambulatory Visit (HOSPITAL_COMMUNITY)
Admission: RE | Admit: 2015-04-13 | Discharge: 2015-04-13 | Disposition: A | Discharge status: Home / Self Care | Payer: Managed Care, Other (non HMO) | Source: Ambulatory Visit | Attending: Pediatrics | Admitting: Pediatrics

## 2015-04-13 DIAGNOSIS — Z1231 Encounter for screening mammogram for malignant neoplasm of breast: Secondary | ICD-10-CM | POA: Insufficient documentation

## 2015-04-22 ENCOUNTER — Ambulatory Visit: Payer: Managed Care, Other (non HMO) | Admitting: Pediatrics

## 2015-04-25 ENCOUNTER — Encounter: Payer: Self-pay | Admitting: Pediatrics

## 2015-05-11 ENCOUNTER — Encounter: Payer: Self-pay | Admitting: Pediatrics

## 2015-05-11 ENCOUNTER — Ambulatory Visit (INDEPENDENT_AMBULATORY_CARE_PROVIDER_SITE_OTHER): Payer: Managed Care, Other (non HMO) | Admitting: Pediatrics

## 2015-05-11 VITALS — BP 163/109 | HR 81 | Temp 98.7°F | Ht 64.0 in | Wt 280.4 lb

## 2015-05-11 DIAGNOSIS — E1165 Type 2 diabetes mellitus with hyperglycemia: Secondary | ICD-10-CM | POA: Diagnosis not present

## 2015-05-11 DIAGNOSIS — Z794 Long term (current) use of insulin: Secondary | ICD-10-CM

## 2015-05-11 DIAGNOSIS — Z1211 Encounter for screening for malignant neoplasm of colon: Secondary | ICD-10-CM

## 2015-05-11 DIAGNOSIS — I10 Essential (primary) hypertension: Secondary | ICD-10-CM | POA: Diagnosis not present

## 2015-05-11 DIAGNOSIS — IMO0001 Reserved for inherently not codable concepts without codable children: Secondary | ICD-10-CM

## 2015-05-11 DIAGNOSIS — Z6841 Body Mass Index (BMI) 40.0 and over, adult: Secondary | ICD-10-CM

## 2015-05-11 DIAGNOSIS — E785 Hyperlipidemia, unspecified: Secondary | ICD-10-CM

## 2015-05-11 MED ORDER — AMLODIPINE BESYLATE 5 MG PO TABS: 5.0000 mg | ORAL_TABLET | Freq: Every day | ORAL | Status: DC

## 2015-05-11 NOTE — Progress Notes (Signed)
Subjective:    Patient ID: Erin Lambert, female    DOB: 03-Jun-1960, 55 y.o.   MRN: [id number]  CC: Follow-up med problems  HPI: Erin Lambert is a 55 y.o. female presenting for Follow-up  HTN: no headaches, vision changes. Taking BP meds most days, didn't take it this morning. Does use salt  BMI: not exercising much, still eating fast food regularly  DM2: drinks sweet tea every morning for breakfast with combo meal Usually 150s-200 when she checks during day. 250 highest she gets here and there First AM BGL under 150  Rash better on wrist   Depression screen [facility] 2/9 05/11/2015 03/25/2015 03/11/2015 06/26/2013  Decreased Interest 0 0 0 2  Down, Depressed, Hopeless 0 0 0 2  PHQ - 2 Score 0 0 0 4  Altered sleeping - - - 1  Tired, decreased energy - - - 3  Change in appetite - - - 0  Feeling bad or failure about yourself  - - - 0  Trouble concentrating - - - 0  Moving slowly or fidgety/restless - - - 0  Suicidal thoughts - - - 0  PHQ-9 Score - - - 8     Relevant past medical, surgical, family and social history reviewed and updated as indicated. Interim medical history since our last visit reviewed. Allergies and medications reviewed and updated.    ROS: Per HPI unless specifically indicated above  History  Smoking status  . Current Every Day Smoker -- 0.50 packs/day for 25 years  . Types: Cigarettes  Smokeless tobacco  . Not on file    Past Medical History Patient Active Problem List   Diagnosis Date Noted  . BMI 45.0-49.9, adult ([place]) 03/11/2015  . Stress at work 06/26/2013  . Insomnia 06/26/2013  . Dermatitis 06/26/2013  . Tinea corporis 02/02/2013  . Pain in joint, ankle and foot 10/26/2012  . Back pain 12/02/2011  . Diabetes mellitus type II, uncontrolled ([place]) 11/24/2010  . Essential hypertension, benign 11/24/2010  . Hyperlipidemia 11/24/2010  . Obesity 11/24/2010  . Leg cramps 11/24/2010       Objective:    BP 163/109 mmHg   Pulse 81  Temp(Src) 98.7 F (37.1 C) (Oral)  Ht _0  (1.626 m)  Wt 280 lb 6.4 oz (127.189 kg)  BMI 48.11 kg/m2  Wt Readings from Last 3 Encounters:  05/11/15 280 lb 6.4 oz (127.189 kg)  03/25/15 276 lb 9.6 oz (125.465 kg)  03/11/15 280 lb 9.6 oz (127.279 kg)     Gen: NAD, alert, cooperative with exam, NCAT EYES: EOMI, no scleral injection or icterus CV: NRRR, normal S1/S2, no murmur, distal pulses 2+ b/l Resp: CTABL, no wheezes, normal WOB Abd: +BS, soft, NTND.  Ext: normal foot exam, sensation intact to monofilament/touch, warm Neuro: Alert and oriented, strength equal b/l UE and LE, coordination grossly normal Skin: area of hyperpigmentation R wrist improved     Assessment & Plan:    Erin Lambert was seen today for follow-up med problems  Diagnoses and all orders for this visit:  Essential hypertension, benign Remains elevated. Start amlodipine, cont lisinopril, HCTZ -     BMP8+EGFR -     amLODipine (NORVASC) 5 MG tablet; Take 1 tablet (5 mg total) by mouth daily.  BMI 45.0-49.9, adult (HCC) Decrease sweet tea Start walking daily 20-30 min  Hyperlipidemia Stable, continue med  Uncontrolled type 2 diabetes mellitus without complication, with long-term current use of insulin (HCC) Foot exam normal Taking insulin more  regularly Due for HgA1c in 4 weeks  Screen for colon cancer Pt didn't return calls, referral form last visit cancelled. -     Ambulatory referral to Gastroenterology   Follow up plan: Return for 2 wk nurse visit BP check. 4 wk Tammy for DM2 f/u.  Assunta Found, MD [facility] 05/11/2015, 9:00 AM

## 2015-05-15 ENCOUNTER — Other Ambulatory Visit: Payer: Self-pay | Admitting: Family Medicine

## 2015-05-16 NOTE — Telephone Encounter (Signed)
[facility] is no longer seen at [facility].   Refill denied.

## 2015-05-23 ENCOUNTER — Telehealth: Payer: Self-pay

## 2015-05-23 MED ORDER — INSULIN ASPART PROT & ASPART (70-30 MIX) 100 UNIT/ML PEN: PEN_INJECTOR | SUBCUTANEOUS | Status: DC

## 2015-05-23 NOTE — Telephone Encounter (Signed)
Patient aware rx sent to pharmacy.  

## 2015-05-23 NOTE — Telephone Encounter (Signed)
Saw Dr Oswaldo DoneVincent 05/11/15  Told her needed a refill on Novalog 70/30    Don't think its been done

## 2015-05-25 ENCOUNTER — Ambulatory Visit (INDEPENDENT_AMBULATORY_CARE_PROVIDER_SITE_OTHER): Payer: Managed Care, Other (non HMO) | Admitting: *Deleted

## 2015-05-25 VITALS — BP 135/86 | HR 90

## 2015-05-25 DIAGNOSIS — I10 Essential (primary) hypertension: Secondary | ICD-10-CM

## 2015-05-25 DIAGNOSIS — Z136 Encounter for screening for cardiovascular disorders: Secondary | ICD-10-CM

## 2015-05-25 DIAGNOSIS — Z0130 Encounter for examination of blood pressure without abnormal findings: Secondary | ICD-10-CM

## 2015-05-25 NOTE — Progress Notes (Signed)
Pt came in today for a 2 week BP check per Dr.Vincent. Pts BP measured 137/85.

## 2015-05-28 ENCOUNTER — Other Ambulatory Visit: Payer: Self-pay | Admitting: Family Medicine

## 2015-05-31 ENCOUNTER — Telehealth: Payer: Self-pay | Admitting: Pediatrics

## 2015-05-31 DIAGNOSIS — Z9889 Other specified postprocedural states: Secondary | ICD-10-CM

## 2015-05-31 NOTE — Telephone Encounter (Signed)
Referral made for colonoscopy- probably cannot due colonoscopy in May but will try.

## 2015-05-31 NOTE — Telephone Encounter (Signed)
Pt aware.

## 2015-05-31 NOTE — Telephone Encounter (Signed)
This is Dr Andree CossVincents patient

## 2015-06-09 ENCOUNTER — Ambulatory Visit: Payer: Managed Care, Other (non HMO) | Admitting: Pharmacist

## 2015-06-16 ENCOUNTER — Telehealth: Payer: Self-pay | Admitting: Pharmacist

## 2015-06-16 NOTE — Telephone Encounter (Signed)
Received correspondance from [employer] that patient appears non compliant with Basaglar insulin.  It appears last insulin Rx was for Humalog 70/30 Mix.  I called patient to reschedule appt missed 06/09/15.  No ans but left message that we would like to rescheduled appt and work with her for better BG control.

## 2015-06-28 ENCOUNTER — Other Ambulatory Visit: Payer: Self-pay | Admitting: *Deleted

## 2015-06-28 DIAGNOSIS — I10 Essential (primary) hypertension: Secondary | ICD-10-CM

## 2015-06-28 MED ORDER — AMLODIPINE BESYLATE 5 MG PO TABS: 5.0000 mg | ORAL_TABLET | Freq: Every day | ORAL | Status: DC

## 2015-08-04 ENCOUNTER — Telehealth: Payer: Self-pay | Admitting: Pharmacist

## 2015-08-04 NOTE — Telephone Encounter (Signed)
Left message for patient - reminded that it is past time to recheck DM.  Stressed inportance of regular follow up and making sure BG is adequately controlled to prevent complications

## 2015-08-10 ENCOUNTER — Encounter: Payer: Self-pay | Admitting: *Deleted

## 2015-08-10 NOTE — Telephone Encounter (Signed)
Attempts to contact patient by phone have been unsuccessful. Will mail a letter asking her to schedule an appointment with Tammy and stressing the importance of this follow up

## 2015-09-08 ENCOUNTER — Encounter: Payer: Self-pay | Admitting: Pediatrics

## 2015-09-08 ENCOUNTER — Ambulatory Visit (INDEPENDENT_AMBULATORY_CARE_PROVIDER_SITE_OTHER): Payer: Managed Care, Other (non HMO) | Admitting: Pediatrics

## 2015-09-08 VITALS — BP 154/100 | HR 88 | Temp 98.3°F | Ht 64.0 in | Wt 271.6 lb

## 2015-09-08 DIAGNOSIS — E785 Hyperlipidemia, unspecified: Secondary | ICD-10-CM

## 2015-09-08 DIAGNOSIS — E1165 Type 2 diabetes mellitus with hyperglycemia: Secondary | ICD-10-CM | POA: Diagnosis not present

## 2015-09-08 DIAGNOSIS — I10 Essential (primary) hypertension: Secondary | ICD-10-CM | POA: Diagnosis not present

## 2015-09-08 DIAGNOSIS — IMO0001 Reserved for inherently not codable concepts without codable children: Secondary | ICD-10-CM

## 2015-09-08 DIAGNOSIS — Z794 Long term (current) use of insulin: Secondary | ICD-10-CM | POA: Diagnosis not present

## 2015-09-08 MED ORDER — ATORVASTATIN CALCIUM 20 MG PO TABS: 20.0000 mg | ORAL_TABLET | Freq: Every day | ORAL | 2 refills | Status: DC

## 2015-09-08 MED ORDER — METFORMIN HCL 500 MG PO TABS: 500.0000 mg | ORAL_TABLET | Freq: Two times a day (BID) | ORAL | 3 refills | Status: DC

## 2015-09-08 MED ORDER — LISINOPRIL 20 MG PO TABS: 20.0000 mg | ORAL_TABLET | Freq: Every day | ORAL | 3 refills | Status: DC

## 2015-09-08 NOTE — Progress Notes (Signed)
    Subjective:    Patient ID: Erin Lambert, female    DOB: June 07, 1960, 55 y.o.   MRN: [id number]  CC: Follow-up and Dizziness   HPI: Erin Lambert is a 55 y.o. female presenting for Follow-up and Dizziness  DM2: taking 8-10u with each meal AM BGLs 150s, occasionally over 200. 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  Interim medical history since our last visit reviewed. Allergies and medications reviewed and updated.  ROS: Per HPI unless specifically indicated above  History  Smoking Status  . Current Every Day Smoker  . Packs/day: 0.50  . Years: 25.00  . Types: Cigarettes  Smokeless Tobacco  . Never Used       Objective:    BP (!) 154/100 (BP Location: Left Arm, Patient Position: Sitting, Cuff Size: Large)   Pulse 88   Temp 98.3 F (36.8 C) (Oral)   Ht 5\' 4"  (1.626 m)   Wt 271 lb 9.6 oz (123.2 kg)   BMI 46.62 kg/m   Wt Readings from Last 3 Encounters:  09/08/15 271 lb 9.6 oz (123.2 kg)  05/11/15 280 lb 6.4 oz (127.2 kg)  03/25/15 276 lb 9.6 oz (125.5 kg)     Gen: NAD, alert, cooperative with exam, NCAT EYES: EOMI, no scleral injection or icterus ENT: OP without erythema LYMPH: no cervical LAD CV: NRRR, normal S1/S2, no murmur, distal pulses 2+ b/l Resp: CTABL, no wheezes, normal WOB Abd: +BS, soft, NTND.  Ext: No edema, warm, normal foot exam Neuro: Alert and oriented     Assessment & Plan:    Erin Lambert was seen today for follow-up medical problems..  Diagnoses and all orders for  this visit:  Essential hypertension, benign Uncontrolled, asymptomatic Continue amlodipine, HCTZ, restart lisinopril RTC for BP recheck -     lisinopril (PRINIVIL,ZESTRIL) 20 MG tablet; Take 1 tablet (20 mg total) by mouth daily.  Uncontrolled type 2 diabetes mellitus without complication, with long-term current use of insulin (HCC) Take NPH BID Add metformin Take BGLs BID Bring back to clinic in 3 weeks Need to get DM2 under control to prevent neuropathy that will make her balance worse -     metFORMIN (GLUCOPHAGE) 500 MG tablet; Take 1 tablet (500 mg total) by mouth 2 (two) times daily with a meal. -     Bayer DCA Hb A1c Waived  Hyperlipidemia Cont med -     atorvastatin (LIPITOR) 20 MG tablet; Take 1 tablet (20 mg total) by mouth at bedtime.     Follow up plan: Return in about 3 weeks (around 09/29/2015).  Rex Kras, MD [facility] 09/08/2015, 10:09 AM

## 2015-09-08 NOTE — Patient Instructions (Addendum)
Metformin: one pill morning and night If you have diarrhea with it that bothers you, can take half a pill twice a day  Take insulin 70/30 twice a day, 10 units  Check sugar before each dose of insulin, write the numbers down and bring to clinic next visit  Blood pressure: Take lisinopril 20mg  once a day HCTZ 25mg  once a day Amlodipine 5mg  once a day

## 2015-10-06 ENCOUNTER — Ambulatory Visit: Payer: Managed Care, Other (non HMO) | Admitting: Pediatrics

## 2015-10-26 ENCOUNTER — Encounter: Payer: Self-pay | Admitting: Pediatrics

## 2015-10-26 ENCOUNTER — Ambulatory Visit (INDEPENDENT_AMBULATORY_CARE_PROVIDER_SITE_OTHER): Payer: Managed Care, Other (non HMO) | Admitting: Pediatrics

## 2015-10-26 VITALS — BP 178/110 | HR 78 | Temp 98.3°F | Ht 64.0 in | Wt 272.0 lb

## 2015-10-26 DIAGNOSIS — I10 Essential (primary) hypertension: Secondary | ICD-10-CM

## 2015-10-26 DIAGNOSIS — E1165 Type 2 diabetes mellitus with hyperglycemia: Secondary | ICD-10-CM | POA: Diagnosis not present

## 2015-10-26 DIAGNOSIS — E669 Obesity, unspecified: Secondary | ICD-10-CM

## 2015-10-26 DIAGNOSIS — Z794 Long term (current) use of insulin: Secondary | ICD-10-CM

## 2015-10-26 DIAGNOSIS — Z1211 Encounter for screening for malignant neoplasm of colon: Secondary | ICD-10-CM | POA: Diagnosis not present

## 2015-10-26 DIAGNOSIS — IMO0001 Reserved for inherently not codable concepts without codable children: Secondary | ICD-10-CM

## 2015-10-26 LAB — BAYER DCA HB A1C WAIVED: HB A1C: 9.5 % — AB (ref <7.0)

## 2015-10-26 MED ORDER — INSULIN DETEMIR 100 UNIT/ML FLEXPEN: 20.0000 [IU] | PEN_INJECTOR | Freq: Every day | SUBCUTANEOUS | 11 refills | Status: DC

## 2015-10-26 MED ORDER — METFORMIN HCL 500 MG PO TABS: 1000.0000 mg | ORAL_TABLET | Freq: Two times a day (BID) | ORAL | 3 refills | Status: DC

## 2015-10-26 MED ORDER — INSULIN ASPART 100 UNIT/ML FLEXPEN: 5.0000 [IU] | PEN_INJECTOR | Freq: Three times a day (TID) | SUBCUTANEOUS | 11 refills | Status: DC

## 2015-10-26 NOTE — Progress Notes (Signed)
Subjective:   Patient ID: Erin Lambert, female    DOB: 1960/06/09, 55 y.o.   MRN: [id number] CC: Follow-up (4 week)  HPI: Erin Lambert is a 55 y.o. female presenting for Follow-up (4 week)  HTN:  Pt has missed takin gher BP meds last few days because she ran out hasnt been checking at home, not sure what her BPs have been Denies ha, cp, SOB, vision changes  DM2: BGLs 230s, checking in the mornings Taking 70/30 when she eats, sometimes 2-3 times a day Pt confused about her insulin regimen Says she has been doing a sliding scale, not clear where she got the SSI as was not discussed at last visit, was suppsoed to take 10u BID but says she has been taking 5 units for every 50 over a 100, also said 8 units for every 50 over 100  Colonoscopy: wants to go to [place]  Tobacco use: not interested in quitting at this time  Relevant past medical, surgical, family and social history reviewed. Allergies and medications reviewed and updated. History  Smoking Status  . Current Every Day Smoker  . Packs/day: 0.50  . Years: 25.00  . Types: Cigarettes  Smokeless Tobacco  . Never Used   ROS: Per HPI   Objective:    BP (!) 164/104   Pulse 83   Temp 98.3 F (36.8 C) (Oral)   Ht _0  (1.626 m)   Wt 272 lb (123.4 kg)   BMI 46.69 kg/m   Wt Readings from Last 3 Encounters:  10/26/15 272 lb (123.4 kg)  09/08/15 271 lb 9.6 oz (123.2 kg)  05/11/15 280 lb 6.4 oz (127.2 kg)    Gen: NAD, alert, cooperative with exam, NCAT EYES: EOMI, no conjunctival injection, or no icterus ENT:  OP without erythema LYMPH: no cervical LAD CV: NRRR, normal S1/S2, no murmur, distal pulses 2+ b/l Resp: CTABL, no wheezes, normal WOB Abd: +BS, soft, NTND. no guarding or organomegaly Ext: No edema, warm Neuro: Alert and oriented  Assessment & Plan:  Erin Lambert was seen today for follow-up multiple med problems.  Diagnoses and all orders for this visit:  Essential hypertension, benign Not  controlled Pt has missed lisinopril past few days Not sure if BP better while on it or not hasnt been checking at home Restart BP meds Check at home Let me know if elevated  Uncontrolled type 2 diabetes mellitus without complication, with long-term current use of insulin ([place]) Needs eye exam, pt aware Stop 70/30 Go back to long acting insulin with short acting insulin SS Take 5 units of short acting novolog with meals Check your sugar before every meal For every 50 over 150 take extra 2 units of short acting insulin with the 5 units for sliding scale Increase metformin to 1025mBID -     insulin aspart (NOVOLOG FLEXPEN) 100 UNIT/ML FlexPen; Inject 5 Units into the skin 3 (three) times daily with meals. -     Insulin Detemir (LEVEMIR) 100 UNIT/ML Pen; Inject 20 Units into the skin daily at 10 pm. -     metFORMIN (GLUCOPHAGE) 500 MG tablet; Take 2 tablets (1,000 mg total) by mouth 2 (two) times daily with a meal. -     Bayer DCA Hb A1c Waived -     BMP8+EGFR  Obesity Cont lifestyle changes, avoiding sugary foods, increased physical activity  Screen for colon cancer -     Ambulatory referral to Gastroenterology [place] Follow up plan: Return for  4 weeks to see Tammy, 8 weeks to see Dr. Evette Doffing med f/u. Assunta Found, MD [place]

## 2015-10-26 NOTE — Patient Instructions (Addendum)
Stop 70/30 insulin  Take long acting levemir/detemir insulin 20 units at night  Take 5 units of short acting novolog with meals Check your sugar before every meal For every 50 over 150 take extra 2 units of short acting insulin with the 5 units for sliding scale  Check blood pressure at home Let me know if >140 on top or >90 on bottom regularly when you are taking all of your blood pressure medicines at home

## 2015-10-27 LAB — BMP8+EGFR
BUN / CREAT RATIO: 18 (ref 9–23)
BUN: 14 mg/dL (ref 6–24)
CO2: 21 mmol/L (ref 18–29)
CREATININE: 0.8 mg/dL (ref 0.57–1.00)
Calcium: 9.5 mg/dL (ref 8.7–10.2)
Chloride: 102 mmol/L (ref 96–106)
GFR, EST AFRICAN AMERICAN: 97 mL/min/{1.73_m2} (ref >59)
GFR, EST NON AFRICAN AMERICAN: 84 mL/min/{1.73_m2} (ref >59)
Glucose: 201 mg/dL — ABNORMAL HIGH (ref 65–99)
Potassium: 4.6 mmol/L (ref 3.5–5.2)
Sodium: 141 mmol/L (ref 134–144)

## 2015-10-27 MED ORDER — LISINOPRIL 20 MG PO TABS: 20.0000 mg | ORAL_TABLET | Freq: Every day | ORAL | 1 refills | Status: DC

## 2015-10-27 NOTE — Addendum Note (Signed)
Addended by: Bearl MulberryUTHERFORD, Yariel Ferraris K on: 10/27/2015 01:07 PM   Modules accepted: Orders

## 2015-10-28 ENCOUNTER — Other Ambulatory Visit: Payer: Self-pay | Admitting: Pediatrics

## 2015-10-28 ENCOUNTER — Telehealth: Payer: Self-pay | Admitting: Pediatrics

## 2015-10-28 ENCOUNTER — Ambulatory Visit (INDEPENDENT_AMBULATORY_CARE_PROVIDER_SITE_OTHER): Payer: Managed Care, Other (non HMO) | Admitting: Pediatrics

## 2015-10-28 ENCOUNTER — Encounter: Payer: Self-pay | Admitting: Pediatrics

## 2015-10-28 VITALS — BP 148/96 | HR 92 | Temp 98.4°F | Ht 64.0 in | Wt 270.4 lb

## 2015-10-28 DIAGNOSIS — Z794 Long term (current) use of insulin: Secondary | ICD-10-CM | POA: Diagnosis not present

## 2015-10-28 DIAGNOSIS — E1165 Type 2 diabetes mellitus with hyperglycemia: Secondary | ICD-10-CM

## 2015-10-28 DIAGNOSIS — I10 Essential (primary) hypertension: Secondary | ICD-10-CM

## 2015-10-28 DIAGNOSIS — L309 Dermatitis, unspecified: Secondary | ICD-10-CM | POA: Diagnosis not present

## 2015-10-28 DIAGNOSIS — IMO0001 Reserved for inherently not codable concepts without codable children: Secondary | ICD-10-CM

## 2015-10-28 MED ORDER — HYDROCORTISONE 2.5 % EX CREA: TOPICAL_CREAM | Freq: Two times a day (BID) | CUTANEOUS | 0 refills | Status: DC

## 2015-10-28 NOTE — Telephone Encounter (Signed)
If she is coming in later tonight we can talk about it then, I see she has an appt scheduled in night clinic. We probably need to increase her BP medicine if she has been taking it regularly since last visit.

## 2015-10-28 NOTE — Telephone Encounter (Signed)
Patient states that she spoke to Dr.Vincent's nurse yesterday and was told to call if her bp went over 140/90. Patient states that this morning her bp was 158/97 at 9:50am.

## 2015-10-28 NOTE — Patient Instructions (Addendum)
Take long acting levemir/detemir insulin 20 units at night  Check your sugar before every meal Take 5 units of short acting novolog with meals to cover your carbohydrates AND  Sliding scale insulin: 151-200=2 units 201-250=4 units 251-300= 6 units    Check blood pressure at home Let me know if >140 on top or >90 on bottom regularly when you are taking all of your blood pressure medicines at home

## 2015-10-28 NOTE — Progress Notes (Signed)
  Subjective:   Patient ID: Erin Lambert, female    DOB: 02-01-1961, 55 y.o.   MRN: [id number] CC: Anal Itching  HPI: Erin Lambert is a 55 y.o. female presenting for Anal Itching  HTN: Took her first lisinopril dose this morning at 4am Has been taking other meds No HA Ate large amount salt this morning as well BP at home yesterday was 150s SBP No HA, no blurry vision, no chest pain  DM2: picked up short and long acting insulin Has started them  Says she [place] in [place] within past year for eye exam  Having itching in gluteal cleft Worse after passing stool Sometimes scratches with toilet paper No bleeding No history of hemorrhoids Was treated for BV with flagyl No vaginal discharge   Relevant past medical, surgical, family and social history reviewed. Allergies and medications reviewed and updated. History  Smoking Status  . Current Every Day Smoker  . Packs/day: 0.50  . Years: 25.00  . Types: Cigarettes  Smokeless Tobacco  . Never Used   ROS: Per HPI   Objective:    BP (!) 148/96   Pulse 92   Temp 98.4 F (36.9 C) (Oral)   Ht 5\' 4"  (1.626 m)   Wt 270 lb 6.4 oz (122.7 kg)   BMI 46.41 kg/m   Wt Readings from Last 3 Encounters:  10/28/15 270 lb 6.4 oz (122.7 kg)  10/26/15 272 lb (123.4 kg)  09/08/15 271 lb 9.6 oz (123.2 kg)   Gen: NAD, alert, cooperative with exam, NCAT EYES: EOMI, no conjunctival injection, or no icterus CV: WWP Resp: normal WOB Abd:  soft, NTND. no guarding or organomegaly Ext: No edema, warm Neuro: Alert and oriented, strength equal b/l UE and LE, coordination grossly normal MSK: normal muscle bulk Rectal: no hemorrhoids, normal tone Skin: slightly hyperpigmented with flaking skin edges of gluteal cleft b/l No redness or irritation around rectum  Assessment & Plan:  Erin DecemberSharon was seen today for anal itching, multiple med problems  Diagnoses and all orders for this visit:  Eczema Stop use of any scented  pads, lotions OK to use vasoline on areas -     hydrocortisone 2.5 % cream; Apply topically 2 (two) times daily.  Essential hypertension, benign Elevated today, just restarted on of her meds this morning and ate large amount of salt If still elevated in 1 week, will increase amlodipine to 10mg  Cont meds  Uncontrolled type 2 diabetes mellitus without complication, with long-term current use of insulin (HCC) 20u levemir every night Short acting insulin 5 units plus SSI three times a day Pt questions answered Thinks she had eye exam done, she isnt sure the name of where, will call back with their pone number  Follow up plan: 4 weeks Rex Krasarol Artemio Dobie, MD [facility]

## 2015-11-08 ENCOUNTER — Telehealth: Payer: Self-pay | Admitting: Pediatrics

## 2015-11-08 NOTE — Telephone Encounter (Signed)
Needs to be sen to adjust meds

## 2015-11-08 NOTE — Telephone Encounter (Signed)
Returned patient's phone call.  Patient is taking amlodipine.  BP readings 146/103,  149/105, 148/101, 155/104, 160/100.

## 2015-11-09 NOTE — Telephone Encounter (Signed)
Appointment made 10/5 at 10:30 with MMM.

## 2015-11-15 ENCOUNTER — Ambulatory Visit (INDEPENDENT_AMBULATORY_CARE_PROVIDER_SITE_OTHER): Payer: Managed Care, Other (non HMO) | Admitting: Nurse Practitioner

## 2015-11-15 ENCOUNTER — Encounter: Payer: Self-pay | Admitting: Nurse Practitioner

## 2015-11-15 VITALS — BP 120/74 | HR 97 | Temp 97.7°F | Ht 64.0 in | Wt 270.0 lb

## 2015-11-15 DIAGNOSIS — I10 Essential (primary) hypertension: Secondary | ICD-10-CM

## 2015-11-15 NOTE — Patient Instructions (Signed)
Hypertension Hypertension, commonly called high blood pressure, is when the force of blood pumping through your arteries is too strong. Your arteries are the blood vessels that carry blood from your heart throughout your body. A blood pressure reading consists of a higher number over a lower number, such as 110/72. The higher number (systolic) is the pressure inside your arteries when your heart pumps. The lower number (diastolic) is the pressure inside your arteries when your heart relaxes. Ideally you want your blood pressure below 120/80. Hypertension forces your heart to work harder to pump blood. Your arteries may become narrow or stiff. Having untreated or uncontrolled hypertension can cause heart attack, stroke, kidney disease, and other problems. RISK FACTORS Some risk factors for high blood pressure are controllable. Others are not.  Risk factors you cannot control include:   Race. You may be at higher risk if you are African American.  Age. Risk increases with age.  Gender. Men are at higher risk than women before age 45 years. After age 65, women are at higher risk than men. Risk factors you can control include:  Not getting enough exercise or physical activity.  Being overweight.  Getting too much fat, sugar, calories, or salt in your diet.  Drinking too much alcohol. SIGNS AND SYMPTOMS Hypertension does not usually cause signs or symptoms. Extremely high blood pressure (hypertensive crisis) may cause headache, anxiety, shortness of breath, and nosebleed. DIAGNOSIS To check if you have hypertension, your health care provider will measure your blood pressure while you are seated, with your arm held at the level of your heart. It should be measured at least twice using the same arm. Certain conditions can cause a difference in blood pressure between your right and left arms. A blood pressure reading that is higher than normal on one occasion does not mean that you need treatment. If  it is not clear whether you have high blood pressure, you may be asked to return on a different day to have your blood pressure checked again. Or, you may be asked to monitor your blood pressure at home for 1 or more weeks. TREATMENT Treating high blood pressure includes making lifestyle changes and possibly taking medicine. Living a healthy lifestyle can help lower high blood pressure. You may need to change some of your habits. Lifestyle changes may include:  Following the DASH diet. This diet is high in fruits, vegetables, and whole grains. It is low in salt, red meat, and added sugars.  Keep your sodium intake below 2,300 mg per day.  Getting at least 30-45 minutes of aerobic exercise at least 4 times per week.  Losing weight if necessary.  Not smoking.  Limiting alcoholic beverages.  Learning ways to reduce stress. Your health care provider may prescribe medicine if lifestyle changes are not enough to get your blood pressure under control, and if one of the following is true:  You are 18-59 years of age and your systolic blood pressure is above 140.  You are 60 years of age or older, and your systolic blood pressure is above 150.  Your diastolic blood pressure is above 90.  You have diabetes, and your systolic blood pressure is over 140 or your diastolic blood pressure is over 90.  You have kidney disease and your blood pressure is above 140/90.  You have heart disease and your blood pressure is above 140/90. Your personal target blood pressure may vary depending on your medical conditions, your age, and other factors. HOME CARE INSTRUCTIONS    Have your blood pressure rechecked as directed by your health care provider.   Take medicines only as directed by your health care provider. Follow the directions carefully. Blood pressure medicines must be taken as prescribed. The medicine does not work as well when you skip doses. Skipping doses also puts you at risk for  problems.  Do not smoke.   Monitor your blood pressure at home as directed by your health care provider. SEEK MEDICAL CARE IF:   You think you are having a reaction to medicines taken.  You have recurrent headaches or feel dizzy.  You have swelling in your ankles.  You have trouble with your vision. SEEK IMMEDIATE MEDICAL CARE IF:  You develop a severe headache or confusion.  You have unusual weakness, numbness, or feel faint.  You have severe chest or abdominal pain.  You vomit repeatedly.  You have trouble breathing. MAKE SURE YOU:   Understand these instructions.  Will watch your condition.  Will get help right away if you are not doing well or get worse.   This information is not intended to replace advice given to you by your health care provider. Make sure you discuss any questions you have with your health care provider.   Document Released: 01/29/2005 Document Revised: 06/15/2014 Document Reviewed: 11/21/2012 Elsevier Interactive Patient Education 2016 Elsevier Inc.  

## 2015-11-15 NOTE — Progress Notes (Signed)
Subjective:    Patient ID: Erin Lambert, female    DOB: Feb 11, 1961, 55 y.o.   MRN: [id number]  HPI Patient here today for follow-up on her blood pressure. She has been checking her BP at home and has been been having SBP over 140s.  Went to urgent care 3 days ago per her company to be able to return to work, because her BP was 170/ 100. At the urgent care her dose of Amldiopine was increased from 28m to 7.543mdaily. Denies chest pain and shortness of breath. Patient is an every day smoker, smokes a pack and half in 2 days.  Current Outpatient Prescriptions on File Prior to Visit  Medication Sig Dispense Refill  . amLODipine (NORVASC) 5 MG tablet Take 1 tablet (5 mg total) by mouth daily. 90 tablet 1  . atorvastatin (LIPITOR) 20 MG tablet Take 1 tablet (20 mg total) by mouth at bedtime. 90 tablet 2  . Blood Glucose Monitoring Suppl (BLOOD GLUCOSE MONITOR SYSTEM) W/DEVICE KIT Use to monitor FSBS 4x daily as directed for fluctuating CBG's. Dx: E11.65. 1 each 0  . glucose blood test strip Use to monitor FSBS 4x daily as directed for fluctuating CBG's. Dx: E11.65. 400 each 3  . hydrochlorothiazide (HYDRODIURIL) 25 MG tablet Take 1 tablet (25 mg total) by mouth daily. 90 tablet 3  . hydrocortisone 2.5 % cream Apply topically 2 (two) times daily. 30 g 0  . insulin aspart (NOVOLOG FLEXPEN) 100 UNIT/ML FlexPen Inject 5 Units into the skin 3 (three) times daily with meals. 15 mL 11  . Insulin Detemir (LEVEMIR) 100 UNIT/ML Pen Inject 20 Units into the skin daily at 10 pm. 15 mL 11  . Insulin Pen Needle 31G X 8 MM MISC Use to inject insulin into skin 4x daily as directed. 200 each 11  . lisinopril (PRINIVIL,ZESTRIL) 20 MG tablet Take 1 tablet (20 mg total) by mouth daily. 90 tablet 1  . metFORMIN (GLUCOPHAGE) 500 MG tablet Take 2 tablets (1,000 mg total) by mouth 2 (two) times daily with a meal. 180 tablet 3  . Multiple Vitamin (MULITIVITAMIN WITH MINERALS) TABS Take 1 tablet by mouth daily.      No current facility-administered medications on file prior to visit.     Review of Systems  Constitutional: Negative.   Respiratory: Negative.   Cardiovascular: Positive for palpitations (occassional). Negative for leg swelling.  Neurological: Positive for headaches (gets them off and on). Negative for dizziness and light-headedness.  Psychiatric/Behavioral: Negative.        Objective:   Physical Exam  Constitutional: She is oriented to person, place, and time. She appears well-developed and well-nourished.  Cardiovascular: Normal rate and intact distal pulses.   Murmur heard. Pulmonary/Chest: Effort normal and breath sounds normal.  Neurological: She is alert and oriented to person, place, and time.    BP 120/74 (BP Location: Left Arm, Cuff Size: Large)   Pulse 97   Temp 97.7 F (36.5 C) (Oral)   Ht _0  (1.626 m)   Wt 270 lb (122.5 kg)   BMI 46.35 kg/m     EKG- normal sinus rhythm  Assessment & Plan:  1. Essential hypertension, benign Continue Amlodipine 7.3m19mnce daily Low salt diet, do not add extra salt to meals Keep log of BP readings and bring log to next appointment - EKG 12-Lead     Health maintenance reviewed Diet and exercise encouraged Continue all meds Follow up  in 2 weeks   QueJari Favre  RN, NP Student   Mary-Margaret Hassell Done, [place]

## 2015-11-17 ENCOUNTER — Ambulatory Visit: Payer: Self-pay | Admitting: Nurse Practitioner

## 2015-11-22 ENCOUNTER — Ambulatory Visit: Payer: Managed Care, Other (non HMO) | Admitting: Pharmacist

## 2015-11-23 ENCOUNTER — Ambulatory Visit (INDEPENDENT_AMBULATORY_CARE_PROVIDER_SITE_OTHER): Payer: Managed Care, Other (non HMO) | Admitting: Pharmacist

## 2015-11-23 VITALS — BP 136/70 | HR 82 | Ht 64.0 in | Wt 269.0 lb

## 2015-11-23 DIAGNOSIS — IMO0001 Reserved for inherently not codable concepts without codable children: Secondary | ICD-10-CM

## 2015-11-23 DIAGNOSIS — Z794 Long term (current) use of insulin: Secondary | ICD-10-CM | POA: Diagnosis not present

## 2015-11-23 DIAGNOSIS — E1165 Type 2 diabetes mellitus with hyperglycemia: Secondary | ICD-10-CM | POA: Diagnosis not present

## 2015-11-23 NOTE — Patient Instructions (Signed)
Diabetes and Standards of Medical Care   Diabetes is complicated. You may find that your diabetes team includes a dietitian, nurse, diabetes educator, eye doctor, and more. To help everyone know what is going on and to help you get the care you deserve, the following schedule of care was developed to help keep you on track. Below are the tests, exams, vaccines, medicines, education, and plans you will need.  Blood Glucose Goals Prior to meals = 80 - 130 Within 2 hours of the start of a meal = less than 180  HbA1c test (goal is less than 7.0% - your last value was 9.5%) This test shows how well you have controlled your glucose over the past 2 to 3 months. It is used to see if your diabetes management plan needs to be adjusted.   It is performed at least 2 times a year if you are meeting treatment goals.  It is performed 4 times a year if therapy has changed or if you are not meeting treatment goals.  Blood pressure test  This test is performed at every routine medical visit. The goal is less than 140/90 mmHg for most people, but 130/80 mmHg in some cases. Ask your health care provider about your goal.  Dental exam  Follow up with the dentist regularly.  Eye exam  If you are diagnosed with type 1 diabetes as a child, get an exam upon reaching the age of 71 years or older and have had diabetes for 3 to 5 years. Yearly eye exams are recommended after that initial eye exam.  If you are diagnosed with type 1 diabetes as an adult, get an exam within 5 years of diagnosis and then yearly.  If you are diagnosed with type 2 diabetes, get an exam as soon as possible after the diagnosis and then yearly.  Foot care exam  Visual foot exams are performed at every routine medical visit. The exams check for cuts, injuries, or other problems with the feet.  A comprehensive foot exam should be done yearly. This includes visual inspection as well as assessing foot pulses and testing for loss of  sensation.  Check your feet nightly for cuts, injuries, or other problems with your feet. Tell your health care provider if anything is not healing.  Kidney function test (urine microalbumin)  This test is performed once a year.  Type 1 diabetes: The first test is performed 5 years after diagnosis.  Type 2 diabetes: The first test is performed at the time of diagnosis.  A serum creatinine and estimated glomerular filtration rate (eGFR) test is done once a year to assess the level of chronic kidney disease (CKD), if present.  Lipid profile (cholesterol, HDL, LDL, triglycerides)  Performed every 5 years for most people.  The goal for LDL is less than 100 mg/dL. If you are at high risk, the goal is less than 70 mg/dL.  The goal for HDL is 40 mg/dL to 50 mg/dL for men and 50 mg/dL to 60 mg/dL for women. An HDL cholesterol of 60 mg/dL or higher gives some protection against heart disease.  The goal for triglycerides is less than 150 mg/dL.  Influenza vaccine, pneumococcal vaccine, and hepatitis B vaccine  The influenza vaccine is recommended yearly.  The pneumococcal vaccine is generally given once in a lifetime. However, there are some instances when another vaccination is recommended. Check with your health care provider.  The hepatitis B vaccine is also recommended for adults with diabetes.  Diabetes self-management education  Education is recommended at diagnosis and ongoing as needed.  Treatment plan  Your treatment plan is reviewed at every medical visit.  Document Released: 11/26/2008 Document Revised: 10/01/2012 Document Reviewed: 07/01/2012 Exit[employer] Patient Information 2014 [employer].   

## 2015-11-25 ENCOUNTER — Encounter: Payer: Self-pay | Admitting: Pharmacist

## 2015-11-25 NOTE — Progress Notes (Signed)
Patient ID: Erin SchwalbeSharon A Lambert, female   DOB: November 18, 1960, 55 y.o.   MRN: [id number]   Subjective:    Erin Lambert is a 55 y.o. female who presents for an initial evaluation of Type 2 diabetes mellitus.  The patient was initially diagnosed with Type 2 diabetes mellitus about 7 years ago.  At that time she was hospitalized for several days.  Since initial diagnosis patient's clinical course with respect to diabetes has been variable.   Her last 2 A1c's have been 9.5% with the last A1c checked 10/26/2015.  Current diabetes meds - Novolog 5 unit prior to each meal with additional correct of 2 units for every 50 points BG is over 100.  Levemir 20 units qam Metformin 500mg  2 tablets bid  Known diabetic complications: none Cardiovascular risk factors: diabetes mellitus, dyslipidemia, hypertension, obesity (BMI >= 30 kg/m2), sedentary lifestyle and smoking/ tobacco exposure  Eye exam current (within one year): patient states she has had eye exam within year but she could not remember name of  where [place] went in [place].  Weight trend: stable Prior visit with CDE: no Current diet: in general, an "unhealthy" diet Current exercise: none Medication Compliance?  Yes  Current monitoring regimen: home blood tests - 2 times daily Home blood sugar records: 180 to 250's Any episodes of hypoglycemia? no  Is She on ACE inhibitor or angiotensin II receptor blocker?  Yes  lisinopril (Prinivil)   Objective:    BP 136/70   Pulse 82   Ht 5\' 4"  (1.626 m)   Wt 269 lb (122 kg)   BMI 46.17 kg/m    A1c = 9.5% (10-26-2015)  Lab Review Glucose (mg/dL)  Date Value  40/98/119109/13/2017 201 (H)  03/11/2015 57 (L)   Glucose, Bld (mg/dL)  Date Value  47/82/956205/15/2015 204 (H)  02/02/2013 270 (H)  10/24/2012 137 (H)   CO2  Date Value  10/26/2015 21 mmol/L  03/11/2015 26 mmol/L  06/26/2013 27 mEq/L   BUN (mg/dL)  Date Value  13/08/657809/13/2017 14  03/11/2015 12  06/26/2013 18  02/02/2013 10  10/24/2012 9    Creat (mg/dL)  Date Value  46/96/295205/15/2015 0.76  02/02/2013 0.67  10/24/2012 0.66   Creatinine, Ser (mg/dL)  Date Value  84/13/244009/13/2017 0.80  03/11/2015 0.77       Assessment:    Diabetes Mellitus type II, under inadequate control.    Plan:    1.  Rx changes: Increase Levemir to 22 units qam - patient is to continue to increase by 2 units every 2 days until FBG in the am is 130 or less.   Continue Novolog - current regimen 2.  Education: Reviewed 'ABCs' of diabetes management (respective goals in parentheses):  A1C (<7), blood pressure (<130/80), and cholesterol (LDL <100). 3. Discussed complications of uncontrolled DM and how to decrease risk of complications 4. CHO counting diet discussed.  Reviewed CHO amount in various foods and how to read nutrition labels.  Discussed recommended serving sizes.  5.  Discussed HBG goals.  Recommend check BG 3-4  times a day 6.  Recommended increase physical activity - goal is 150 minutes per week 7. Follow up: 1 month  With PCP, 3 months with me.

## 2015-11-28 ENCOUNTER — Ambulatory Visit: Payer: Managed Care, Other (non HMO) | Admitting: Pediatrics

## 2015-11-29 ENCOUNTER — Encounter: Payer: Self-pay | Admitting: Pediatrics

## 2015-12-14 ENCOUNTER — Other Ambulatory Visit: Payer: Self-pay

## 2015-12-14 DIAGNOSIS — IMO0001 Reserved for inherently not codable concepts without codable children: Secondary | ICD-10-CM

## 2015-12-14 DIAGNOSIS — Z794 Long term (current) use of insulin: Principal | ICD-10-CM

## 2015-12-14 DIAGNOSIS — E1165 Type 2 diabetes mellitus with hyperglycemia: Principal | ICD-10-CM

## 2015-12-14 MED ORDER — INSULIN DETEMIR 100 UNIT/ML FLEXPEN: 20.0000 [IU] | PEN_INJECTOR | Freq: Every day | SUBCUTANEOUS | 3 refills | Status: DC

## 2015-12-19 ENCOUNTER — Other Ambulatory Visit: Payer: Self-pay | Admitting: Pediatrics

## 2015-12-19 MED ORDER — INSULIN DETEMIR 100 UNIT/ML FLEXPEN: 20.0000 [IU] | PEN_INJECTOR | Freq: Every day | SUBCUTANEOUS | 0 refills | Status: DC

## 2015-12-19 NOTE — Addendum Note (Signed)
Addended by: Tamera PuntWRAY, WENDY S on: 12/19/2015 01:38 PM   Modules accepted: Orders

## 2015-12-21 ENCOUNTER — Ambulatory Visit: Payer: Managed Care, Other (non HMO) | Admitting: Pediatrics

## 2016-02-28 ENCOUNTER — Ambulatory Visit: Payer: Self-pay | Admitting: Pharmacist

## 2016-03-08 ENCOUNTER — Telehealth: Payer: Self-pay | Admitting: Pediatrics

## 2016-03-12 ENCOUNTER — Ambulatory Visit: Payer: Self-pay | Admitting: Pharmacist

## 2016-03-16 NOTE — Telephone Encounter (Signed)
Patient has an appt with Tammy on 02/05

## 2016-03-19 ENCOUNTER — Ambulatory Visit: Payer: Self-pay | Admitting: Pharmacist

## 2016-03-20 ENCOUNTER — Other Ambulatory Visit: Payer: Self-pay | Admitting: Pediatrics

## 2016-03-20 ENCOUNTER — Encounter: Payer: Self-pay | Admitting: Pediatrics

## 2016-03-20 DIAGNOSIS — IMO0001 Reserved for inherently not codable concepts without codable children: Secondary | ICD-10-CM

## 2016-03-20 DIAGNOSIS — Z794 Long term (current) use of insulin: Principal | ICD-10-CM

## 2016-03-20 DIAGNOSIS — E1165 Type 2 diabetes mellitus with hyperglycemia: Principal | ICD-10-CM

## 2016-03-29 ENCOUNTER — Other Ambulatory Visit: Payer: Self-pay | Admitting: Pediatrics

## 2016-04-10 ENCOUNTER — Other Ambulatory Visit: Payer: Self-pay | Admitting: Pediatrics

## 2016-04-10 DIAGNOSIS — I10 Essential (primary) hypertension: Secondary | ICD-10-CM

## 2016-04-16 ENCOUNTER — Other Ambulatory Visit: Payer: Self-pay | Admitting: *Deleted

## 2016-04-16 ENCOUNTER — Telehealth: Payer: Self-pay | Admitting: Pediatrics

## 2016-04-16 DIAGNOSIS — I10 Essential (primary) hypertension: Secondary | ICD-10-CM

## 2016-04-16 MED ORDER — AMLODIPINE BESYLATE 5 MG PO TABS: 5.0000 mg | ORAL_TABLET | Freq: Every day | ORAL | 0 refills | Status: DC

## 2016-04-16 NOTE — Telephone Encounter (Signed)
She also wants a new meter.

## 2016-04-16 NOTE — Telephone Encounter (Signed)
What is the name of the medication? Needs strips for one touch ultra mini, alcohol wipes and lancets as long as it is free.  Have you contacted your pharmacy to request a refill? yes  Which pharmacy would you like this sent to? [facility] [address] [facility] [place].   Patient notified that their request is being sent to the clinical staff for review and that they should receive a call once it is complete. If they do not receive a call within 24 hours they can check with their pharmacy or our office.

## 2016-04-29 ENCOUNTER — Other Ambulatory Visit: Payer: Self-pay | Admitting: Pediatrics

## 2016-04-29 DIAGNOSIS — I10 Essential (primary) hypertension: Secondary | ICD-10-CM

## 2016-04-30 IMAGING — MG MM DIGITAL SCREENING
5 series · 5 of 5 positions shown · non-contrast
Comparison: Previous exam(s).

CLINICAL DATA: Screening.

EXAM:
DIGITAL SCREENING BILATERAL MAMMOGRAM WITH CAD

[R CC]
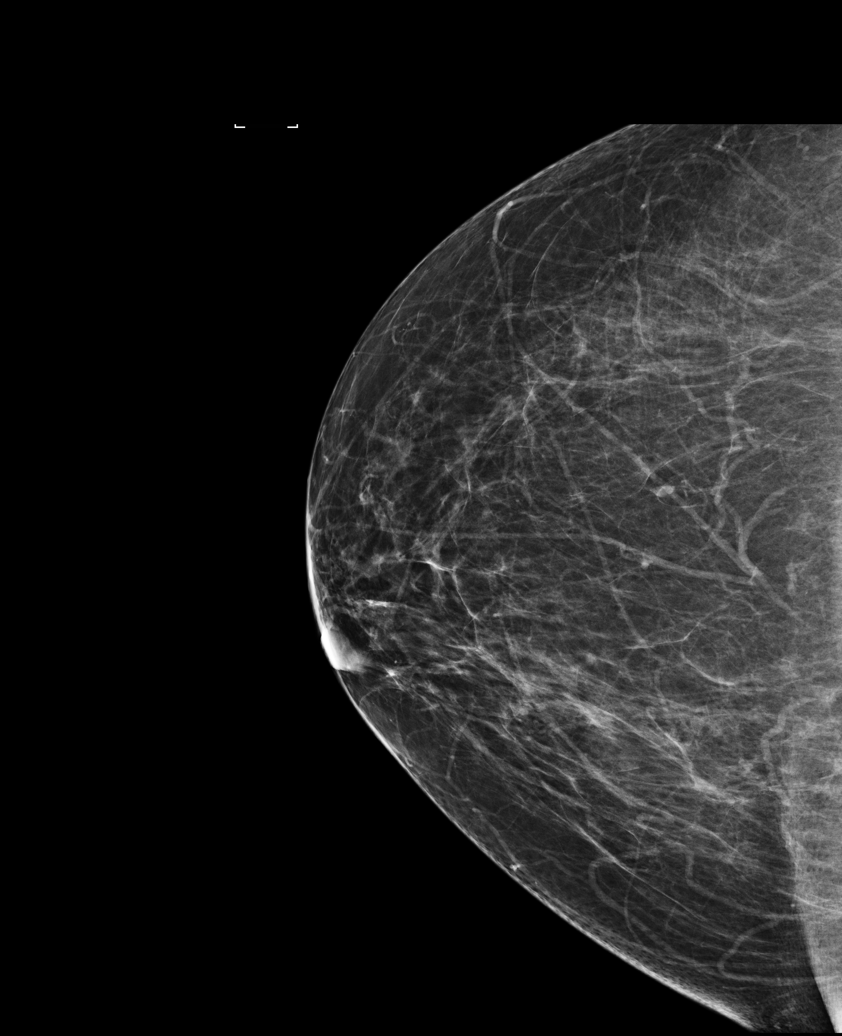

[R CV]
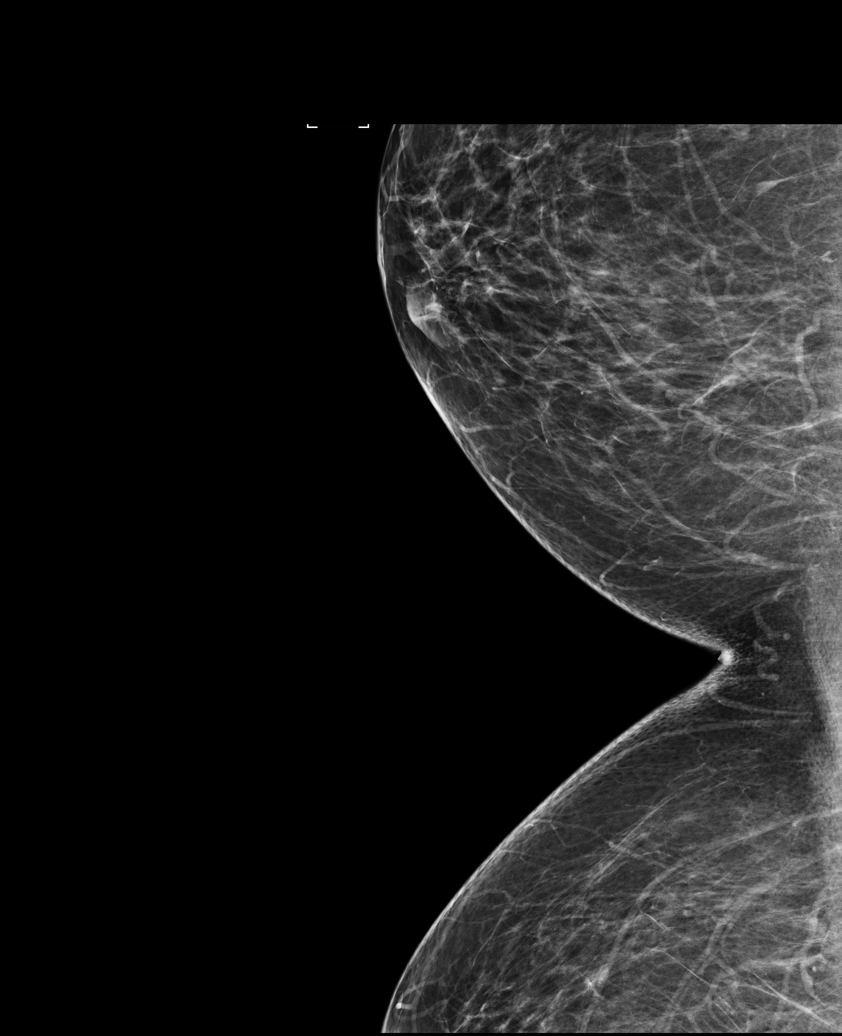

[L CC]
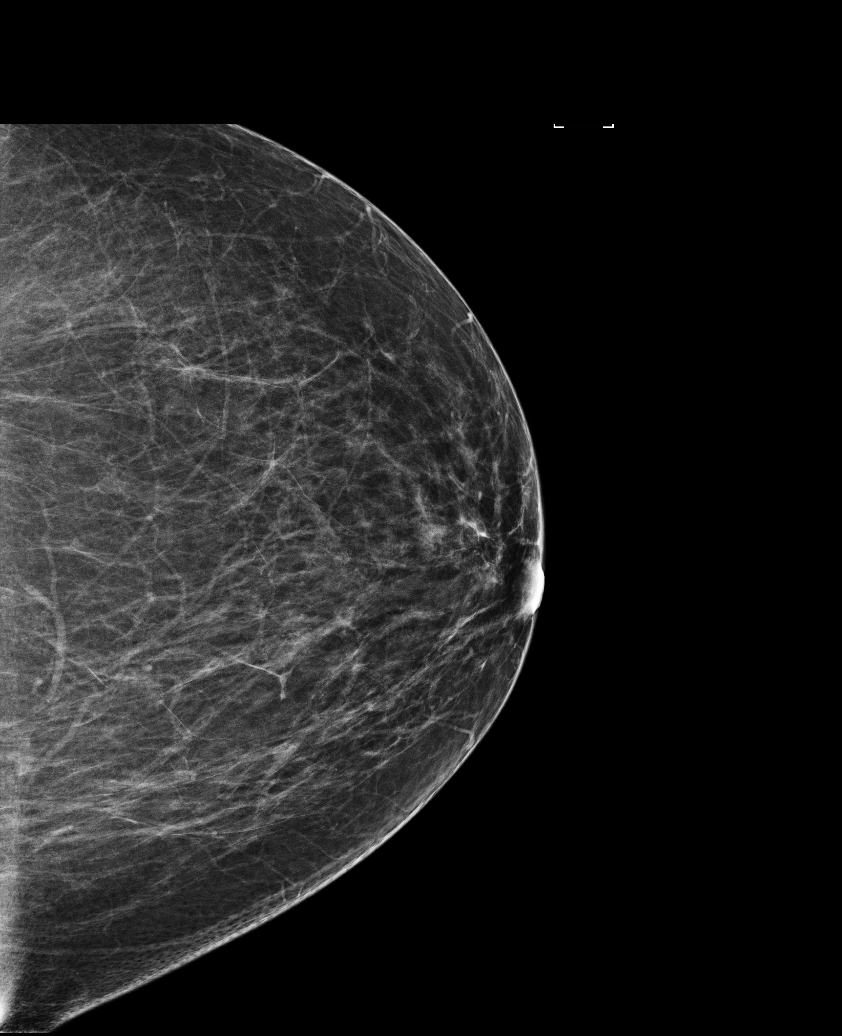

[L MLO]
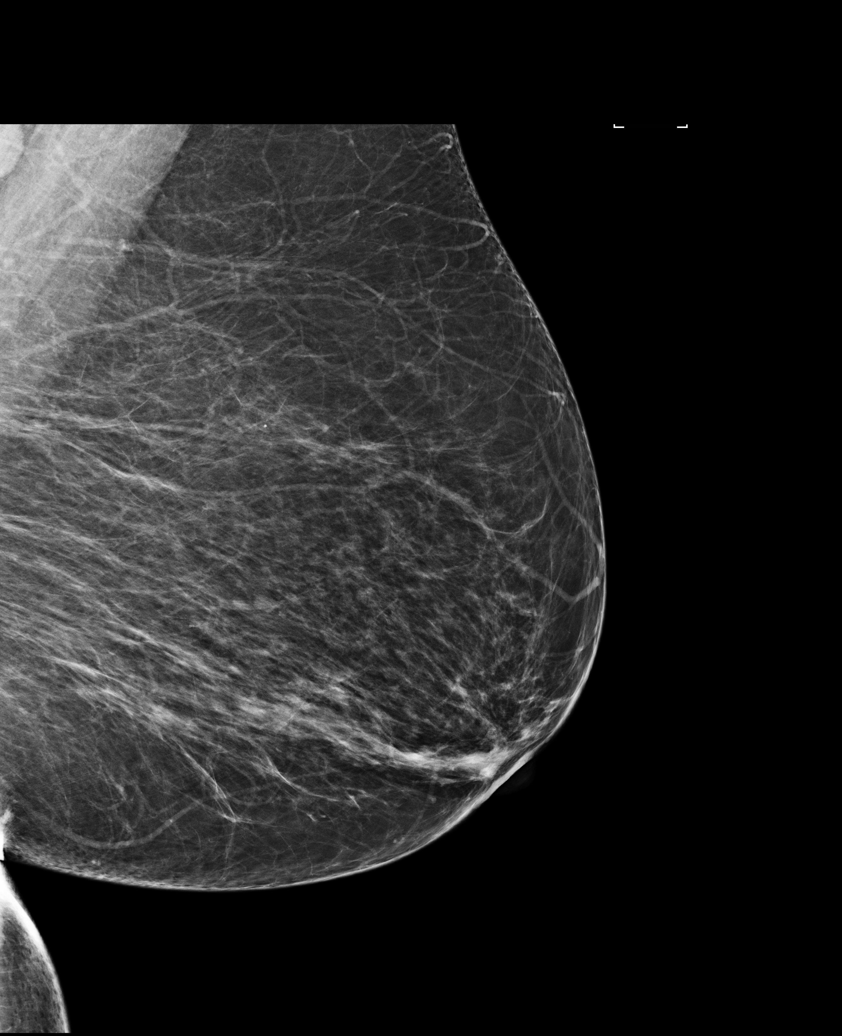

[R MLO]
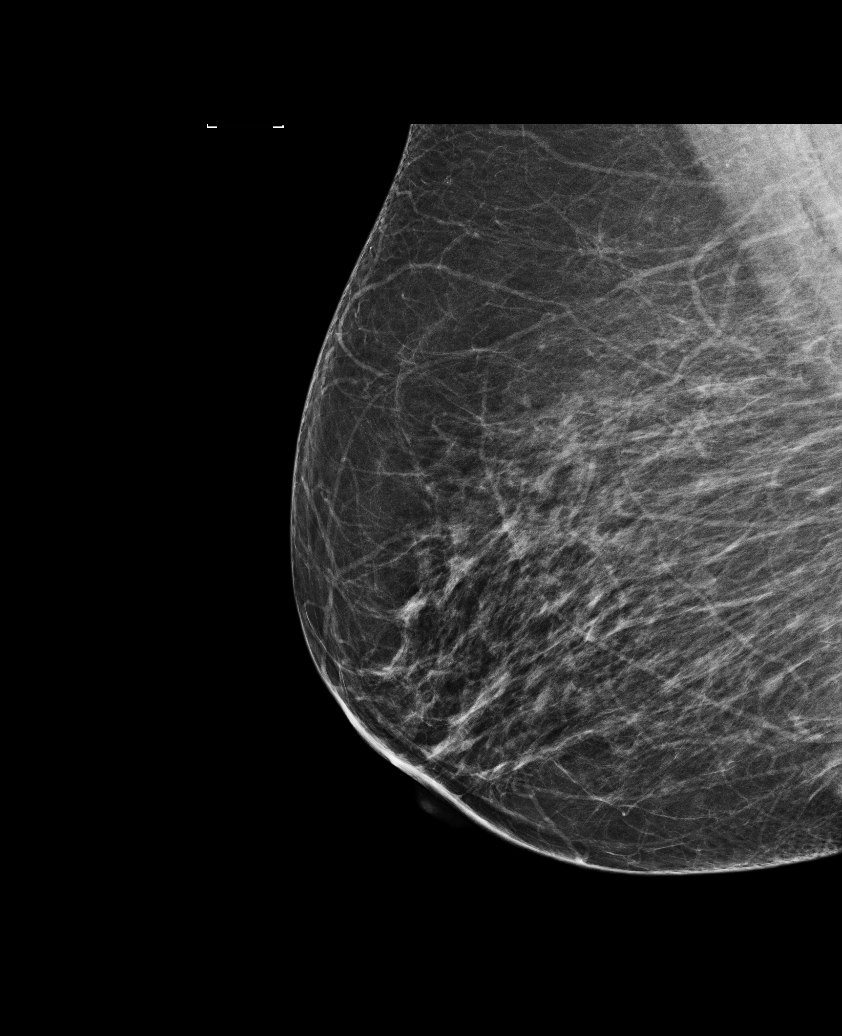

[5 of 5 positions shown; findings below may reference images not displayed]

ACR Breast Density Category b: There are scattered areas of
fibroglandular density.
FINDINGS: There are no findings suspicious for malignancy. Images were
processed with CAD.
IMPRESSION: No mammographic evidence of malignancy. A result letter of this
screening mammogram will be mailed directly to the patient.

RECOMMENDATION:
Screening mammogram in one year. (Code:AS-G-LCT)

BI-RADS CATEGORY  1: Negative.

## 2016-06-07 ENCOUNTER — Other Ambulatory Visit: Payer: Self-pay | Admitting: Pediatrics

## 2016-06-07 DIAGNOSIS — E785 Hyperlipidemia, unspecified: Secondary | ICD-10-CM

## 2016-06-07 NOTE — Telephone Encounter (Signed)
Last seen 10/2015. Last labs 02/2015

## 2016-06-11 NOTE — Progress Notes (Signed)
Done 04/13/2015

## 2016-06-29 ENCOUNTER — Other Ambulatory Visit: Payer: Self-pay | Admitting: Pediatrics

## 2016-06-29 DIAGNOSIS — I10 Essential (primary) hypertension: Secondary | ICD-10-CM

## 2016-06-30 ENCOUNTER — Other Ambulatory Visit: Payer: Self-pay | Admitting: Pediatrics

## 2016-07-02 NOTE — Telephone Encounter (Signed)
Last refill without being seen 

## 2016-07-02 NOTE — Telephone Encounter (Signed)
LMOVM NTBS before next refill 

## 2016-07-19 ENCOUNTER — Other Ambulatory Visit: Payer: Self-pay | Admitting: Pediatrics

## 2016-07-19 DIAGNOSIS — I10 Essential (primary) hypertension: Secondary | ICD-10-CM

## 2016-07-31 ENCOUNTER — Other Ambulatory Visit: Payer: Self-pay | Admitting: Pediatrics

## 2016-07-31 DIAGNOSIS — I10 Essential (primary) hypertension: Secondary | ICD-10-CM

## 2016-09-22 ENCOUNTER — Other Ambulatory Visit: Payer: Self-pay | Admitting: Pediatrics

## 2016-09-22 DIAGNOSIS — E785 Hyperlipidemia, unspecified: Secondary | ICD-10-CM

## 2016-10-14 ENCOUNTER — Other Ambulatory Visit: Payer: Self-pay | Admitting: Pediatrics

## 2016-10-14 DIAGNOSIS — I10 Essential (primary) hypertension: Secondary | ICD-10-CM

## 2016-10-16 NOTE — Telephone Encounter (Signed)
Next Ov 10/26/15

## 2016-10-25 ENCOUNTER — Encounter: Payer: Self-pay | Admitting: Pediatrics

## 2016-10-25 ENCOUNTER — Ambulatory Visit (INDEPENDENT_AMBULATORY_CARE_PROVIDER_SITE_OTHER): Payer: Managed Care, Other (non HMO) | Admitting: Pediatrics

## 2016-10-25 DIAGNOSIS — E785 Hyperlipidemia, unspecified: Secondary | ICD-10-CM | POA: Diagnosis not present

## 2016-10-25 DIAGNOSIS — IMO0001 Reserved for inherently not codable concepts without codable children: Secondary | ICD-10-CM

## 2016-10-25 DIAGNOSIS — E1165 Type 2 diabetes mellitus with hyperglycemia: Secondary | ICD-10-CM | POA: Diagnosis not present

## 2016-10-25 DIAGNOSIS — I10 Essential (primary) hypertension: Secondary | ICD-10-CM

## 2016-10-25 DIAGNOSIS — Z794 Long term (current) use of insulin: Secondary | ICD-10-CM

## 2016-10-25 LAB — BAYER DCA HB A1C WAIVED: HB A1C: 8.7 % — AB (ref <7.0)

## 2016-10-25 MED ORDER — INSULIN ASPART 100 UNIT/ML FLEXPEN: 5.0000 [IU] | PEN_INJECTOR | Freq: Three times a day (TID) | SUBCUTANEOUS | 11 refills | Status: DC

## 2016-10-25 MED ORDER — BLOOD GLUCOSE MONITOR SYSTEM W/DEVICE KIT: PACK | 0 refills | Status: AC

## 2016-10-25 MED ORDER — METFORMIN HCL 500 MG PO TABS: 1000.0000 mg | ORAL_TABLET | Freq: Two times a day (BID) | ORAL | 3 refills | Status: DC

## 2016-10-25 MED ORDER — ATORVASTATIN CALCIUM 20 MG PO TABS: 20.0000 mg | ORAL_TABLET | Freq: Every day | ORAL | 0 refills | Status: AC

## 2016-10-25 MED ORDER — AMLODIPINE BESYLATE 2.5 MG PO TABS: ORAL_TABLET | ORAL | 3 refills | Status: DC

## 2016-10-25 MED ORDER — LISINOPRIL 20 MG PO TABS
20.0000 mg | ORAL_TABLET | Freq: Every day | ORAL | 0 refills | Status: DC
Start: 2016-10-25 — End: 2016-12-19

## 2016-10-25 MED ORDER — AMLODIPINE BESYLATE 5 MG PO TABS: ORAL_TABLET | ORAL | 0 refills | Status: DC

## 2016-10-25 MED ORDER — INSULIN DETEMIR 100 UNIT/ML FLEXPEN: 20.0000 [IU] | PEN_INJECTOR | Freq: Every day | SUBCUTANEOUS | 0 refills | Status: DC

## 2016-10-25 MED ORDER — AMLODIPINE BESYLATE 10 MG PO TABS: 10.0000 mg | ORAL_TABLET | Freq: Every day | ORAL | 1 refills | Status: DC

## 2016-10-25 MED ORDER — INSULIN PEN NEEDLE 31G X 8 MM MISC: 11 refills | Status: AC

## 2016-10-25 MED ORDER — HYDROCHLOROTHIAZIDE 25 MG PO TABS: 25.0000 mg | ORAL_TABLET | Freq: Every day | ORAL | 0 refills | Status: AC

## 2016-10-25 NOTE — Progress Notes (Signed)
Subjective:   Patient ID: Erin Lambert, female    DOB: 1960-08-26, 56 y.o.   MRN: [id number] CC: Hypertension (pt here today for routine follow up of her chronic medical conditions and also c/o right knee pain.)  HPI: Erin Lambert is a 56 y.o. female presenting for Hypertension (pt here today for routine follow up of her chronic medical conditions and also c/o right knee pain.)  DM2: at home BGLs low 200s Using 26u long acting Short acting doing 5u plus 2 for every 50 over 100  Still working alternating day and night shifts  Off of metformin bc she ran out  Saw eye doctor 2 weeks ago, said no problems, nl exams  HTN: has cuff at home, hasnt been checking Has been taking meds regularly  Relevant past medical, surgical, family and social history reviewed. Allergies and medications reviewed and updated. History  Smoking Status  . Current Every Day Smoker  . Packs/day: 0.50  . Years: 25.00  . Types: Cigarettes  Smokeless Tobacco  . Never Used   ROS: Per HPI   Objective:    BP (!) 161/92   Pulse 91   Temp 98.3 F (36.8 C) (Oral)   Ht _0  (1.626 m)   Wt 285 lb (129.3 kg)   BMI 48.92 kg/m   Wt Readings from Last 3 Encounters:  10/25/16 285 lb (129.3 kg)  11/23/15 269 lb (122 kg)  11/15/15 270 lb (122.5 kg)    Gen: NAD, alert, cooperative with exam, NCAT EYES: EOMI, no conjunctival injection, or no icterus ENT:  OP without erythema LYMPH: no cervical LAD CV: NRRR, normal S1/S2, no murmur, distal pulses 2+ b/l Resp: CTABL, no wheezes, normal WOB Abd: +BS, soft, NTND. no guarding or organomegaly Ext: No edema, warm Neuro: Alert and oriented MSK: normal muscle bulk  Assessment & Plan:  Willy was seen today for hypertension.  Diagnoses and all orders for this visit:  Uncontrolled type 2 diabetes mellitus without complication, with long-term current use of insulin (HCC) A1c 8.5, improved from 9.5 last fall, still elevated Check BGLs before  breakfast/dinner Bring in clinic in 4 weeks Cont insulin as below -     metFORMIN (GLUCOPHAGE) 500 MG tablet; Take 2 tablets (1,000 mg total) by mouth 2 (two) times daily with a meal. -     Insulin Detemir (LEVEMIR FLEXTOUCH) 100 UNIT/ML Pen; Inject 20 Units into the skin daily at 10 pm. -     insulin aspart (NOVOLOG FLEXPEN) 100 UNIT/ML FlexPen; Inject 5 Units into the skin 3 (three) times daily with meals. -     CBC with Differential/Platelet -     Bayer DCA Hb A1c Waived  Essential hypertension, benign Slightly elevated today, says she didn't take her medicines, was rushing to get here Has cuff at home checkat home Let me know if persistently elevated Bring numbers to next clinic visit -     lisinopril (PRINIVIL,ZESTRIL) 20 MG tablet; Take 1 tablet (20 mg total) by mouth daily. -     Discontinue: amLODipine (NORVASC) 5 MG tablet; TAKE 1 TABLET BY MOUTH EVERY DAY -     hydrochlorothiazide (HYDRODIURIL) 25 MG tablet; Take 1 tablet (25 mg total) by mouth daily. -     CMP14+EGFR -     amLODipine (NORVASC) 10 MG tablet; Take 1 tablet (10 mg total) by mouth daily.  Hyperlipidemia, unspecified hyperlipidemia type Stable, cont below -     atorvastatin (LIPITOR) 20 MG tablet; Take 1 tablet (20  mg total) by mouth at bedtime. -     Lipid panel  Follow up plan: 4 wks Assunta Found, MD [place]

## 2016-10-26 LAB — LIPID PANEL
CHOL/HDL RATIO: 2.8 ratio (ref 0.0–4.4)
CHOLESTEROL TOTAL: 151 mg/dL (ref 100–199)
HDL: 53 mg/dL (ref >39)
LDL Calculated: 80 mg/dL (ref 0–99)
TRIGLYCERIDES: 92 mg/dL (ref 0–149)
VLDL Cholesterol Cal: 18 mg/dL (ref 5–40)

## 2016-10-26 LAB — CMP14+EGFR
A/G RATIO: 1.5 (ref 1.2–2.2)
ALK PHOS: 76 IU/L (ref 39–117)
ALT: 33 IU/L — ABNORMAL HIGH (ref 0–32)
AST: 19 IU/L (ref 0–40)
Albumin: 4.2 g/dL (ref 3.5–5.5)
BUN/Creatinine Ratio: 17 (ref 9–23)
BUN: 12 mg/dL (ref 6–24)
Bilirubin Total: 0.2 mg/dL (ref 0.0–1.2)
CO2: 23 mmol/L (ref 20–29)
Calcium: 9.4 mg/dL (ref 8.7–10.2)
Chloride: 103 mmol/L (ref 96–106)
Creatinine, Ser: 0.72 mg/dL (ref 0.57–1.00)
GFR calc Af Amer: 110 mL/min/{1.73_m2} (ref >59)
GFR calc non Af Amer: 95 mL/min/{1.73_m2} (ref >59)
GLOBULIN, TOTAL: 2.8 g/dL (ref 1.5–4.5)
Glucose: 165 mg/dL — ABNORMAL HIGH (ref 65–99)
POTASSIUM: 4.3 mmol/L (ref 3.5–5.2)
SODIUM: 140 mmol/L (ref 134–144)
Total Protein: 7 g/dL (ref 6.0–8.5)

## 2016-10-26 LAB — CBC WITH DIFFERENTIAL/PLATELET
BASOS: 0 %
Basophils Absolute: 0 10*3/uL (ref 0.0–0.2)
EOS (ABSOLUTE): 0.1 10*3/uL (ref 0.0–0.4)
EOS: 2 %
HEMOGLOBIN: 13.2 g/dL (ref 11.1–15.9)
Hematocrit: 39.7 % (ref 34.0–46.6)
Immature Grans (Abs): 0 10*3/uL (ref 0.0–0.1)
Immature Granulocytes: 0 %
LYMPHS ABS: 3.6 10*3/uL — AB (ref 0.7–3.1)
LYMPHS: 48 %
MCH: 30.4 pg (ref 26.6–33.0)
MCHC: 33.2 g/dL (ref 31.5–35.7)
MCV: 92 fL (ref 79–97)
MONOS ABS: 0.5 10*3/uL (ref 0.1–0.9)
Monocytes: 6 %
NEUTROS ABS: 3.3 10*3/uL (ref 1.4–7.0)
NEUTROS PCT: 44 %
Platelets: 235 10*3/uL (ref 150–379)
RBC: 4.34 x10E6/uL (ref 3.77–5.28)
RDW: 13.5 % (ref 12.3–15.4)
WBC: 7.5 10*3/uL (ref 3.4–10.8)

## 2016-10-29 ENCOUNTER — Telehealth: Payer: Self-pay | Admitting: Pediatrics

## 2016-10-30 ENCOUNTER — Other Ambulatory Visit: Payer: Self-pay

## 2016-10-30 MED ORDER — GLUCOSE BLOOD VI STRP: ORAL_STRIP | 1 refills | Status: DC

## 2016-10-30 MED ORDER — ONETOUCH ULTRASOFT LANCETS MISC: 1 refills | Status: AC

## 2016-11-30 ENCOUNTER — Other Ambulatory Visit: Payer: Self-pay

## 2016-11-30 DIAGNOSIS — E1165 Type 2 diabetes mellitus with hyperglycemia: Principal | ICD-10-CM

## 2016-11-30 DIAGNOSIS — Z794 Long term (current) use of insulin: Principal | ICD-10-CM

## 2016-11-30 DIAGNOSIS — I10 Essential (primary) hypertension: Secondary | ICD-10-CM

## 2016-11-30 DIAGNOSIS — IMO0001 Reserved for inherently not codable concepts without codable children: Secondary | ICD-10-CM

## 2016-11-30 MED ORDER — METFORMIN HCL 500 MG PO TABS: 1000.0000 mg | ORAL_TABLET | Freq: Two times a day (BID) | ORAL | 0 refills | Status: DC

## 2016-11-30 MED ORDER — AMLODIPINE BESYLATE 10 MG PO TABS: 10.0000 mg | ORAL_TABLET | Freq: Every day | ORAL | 1 refills | Status: AC

## 2016-12-19 ENCOUNTER — Ambulatory Visit (INDEPENDENT_AMBULATORY_CARE_PROVIDER_SITE_OTHER): Payer: Managed Care, Other (non HMO) | Admitting: Pediatrics

## 2016-12-19 ENCOUNTER — Encounter: Payer: Self-pay | Admitting: Pediatrics

## 2016-12-19 VITALS — BP 133/94 | HR 91 | Temp 99.3°F | Ht 64.0 in | Wt 280.6 lb

## 2016-12-19 DIAGNOSIS — Z1211 Encounter for screening for malignant neoplasm of colon: Secondary | ICD-10-CM

## 2016-12-19 DIAGNOSIS — IMO0001 Reserved for inherently not codable concepts without codable children: Secondary | ICD-10-CM

## 2016-12-19 DIAGNOSIS — E785 Hyperlipidemia, unspecified: Secondary | ICD-10-CM | POA: Diagnosis not present

## 2016-12-19 DIAGNOSIS — Z794 Long term (current) use of insulin: Secondary | ICD-10-CM

## 2016-12-19 DIAGNOSIS — Z6841 Body Mass Index (BMI) 40.0 and over, adult: Secondary | ICD-10-CM

## 2016-12-19 DIAGNOSIS — I10 Essential (primary) hypertension: Secondary | ICD-10-CM

## 2016-12-19 DIAGNOSIS — E1165 Type 2 diabetes mellitus with hyperglycemia: Secondary | ICD-10-CM | POA: Diagnosis not present

## 2016-12-19 MED ORDER — LISINOPRIL 40 MG PO TABS: 40.0000 mg | ORAL_TABLET | Freq: Every day | ORAL | 3 refills | Status: DC

## 2016-12-19 MED ORDER — INSULIN ASPART 100 UNIT/ML FLEXPEN: 5.0000 [IU] | PEN_INJECTOR | Freq: Three times a day (TID) | SUBCUTANEOUS | 11 refills | Status: DC

## 2016-12-19 MED ORDER — INSULIN DETEMIR 100 UNIT/ML FLEXPEN: 30.0000 [IU] | PEN_INJECTOR | Freq: Every day | SUBCUTANEOUS | 2 refills | Status: DC

## 2016-12-19 NOTE — Progress Notes (Signed)
  Subjective:   Patient ID: Erin SchwalbeSharon A Lambert, female    DOB: December 03, 1960, 56 y.o.   MRN: [id number] CC: Hypertension  HPI: Erin Lambert is a 56 y.o. female presenting for Hypertension  HTN: taking meds regularly No CP or SOB BP has been elevated multiple times at work recently Has had headaches consistently over past week, feel similar to usual HA Has ahd new floaters in vision over past couple of weeks  DM2: checking blood sugars at home every morning 200s usually in the morning Taking 26u of levemir, taking 5-15u of insulin sliding scale before each meal  Vision changes: got new glasses about a month ago Was told she had new spots on the back of her eyes Got eye exam in [place] I do not have access to these office notes  Has not had screening colonoscopy  Relevant past medical, surgical, family and social history reviewed. Allergies and medications reviewed and updated. Social History   Tobacco Use  Smoking Status Current Every Day Smoker  . Packs/day: 0.50  . Years: 25.00  . Pack years: 12.50  . Types: Cigarettes  Smokeless Tobacco Never Used   ROS: Per HPI   Objective:    BP (!) 133/94   Pulse 91   Temp 99.3 F (37.4 C) (Oral)   Ht 5\' 4"  (1.626 m)   Wt 280 lb 9.6 oz (127.3 kg)   BMI 48.16 kg/m   Wt Readings from Last 3 Encounters:  12/19/16 280 lb 9.6 oz (127.3 kg)  10/25/16 285 lb (129.3 kg)  11/23/15 269 lb (122 kg)    Gen: NAD, alert, cooperative with exam, NCAT EYES: EOMI, no conjunctival injection, or no icterus ENT:   OP without erythema LYMPH: no cervical LAD CV: NRRR, normal S1/S2, no murmur, distal pulses 2+ b/l Resp: CTABL, no wheezes, normal WOB Abd: +BS, soft, NTND. no guarding or organomegaly Ext: No edema, warm Neuro: Alert and oriented, strength equal b/l UE and LE, sensation equal b/l LE, hands, coordination grossly normal MSK: normal muscle bulk  Assessment & Plan:  Erin DecemberSharon was seen today for hypertension.  Diagnoses and  all orders for this visit:  Uncontrolled type 2 diabetes mellitus without complication, with long-term current use of insulin (HCC) Fasting AM BGLs remain elevated, does not have BGLs recorded with her Encouraged pt to check and write down numbers and bring to next visit Will schedule her with diabetic educator, f/u with me in 5 weeks -     Insulin Detemir (LEVEMIR FLEXTOUCH) 100 UNIT/ML Pen; Inject 30 Units daily at 10 pm into the skin. -     insulin aspart (NOVOLOG FLEXPEN) 100 UNIT/ML FlexPen; Inject 5-20 Units 3 (three) times daily with meals into the skin. Per sliding scale  Essential hypertension, benign Elevated regularly at home, elevated today Increase lisinopril If HA do not improve with improved control rtc Return precautions discussed F/u with eye doctor for new floaters -     lisinopril (PRINIVIL,ZESTRIL) 40 MG tablet; Take 1 tablet (40 mg total) daily by mouth.  Class 3 severe obesity with serious comorbidity and body mass index (BMI) of 45.0 to 49.9 in adult, unspecified obesity type (HCC) Cont lifestyle changes, increased activity, decrease sugar intake  Hyperlipidemia, unspecified hyperlipidemia type Stable, on lipitor, cont  Screen for colon cancer Refer to GI  Follow up plan: Return in about 5 weeks (around 01/24/2017). Erin Krasarol Vincent, MD [facility]

## 2016-12-19 NOTE — Patient Instructions (Addendum)
Bring blood pressure cuff next visit Check blood sugars before each meal, bring numbers to clinic  We are increasing levemir to 30u at night Increasing lisinopril to 40mg  once a day Take metformin 2 tabs twice a day

## 2016-12-21 ENCOUNTER — Ambulatory Visit: Payer: Managed Care, Other (non HMO) | Admitting: Pediatrics

## 2016-12-26 ENCOUNTER — Ambulatory Visit: Payer: Managed Care, Other (non HMO) | Admitting: Pediatrics

## 2016-12-27 ENCOUNTER — Other Ambulatory Visit: Payer: Self-pay | Admitting: *Deleted

## 2016-12-27 DIAGNOSIS — IMO0001 Reserved for inherently not codable concepts without codable children: Secondary | ICD-10-CM

## 2016-12-27 DIAGNOSIS — Z794 Long term (current) use of insulin: Principal | ICD-10-CM

## 2016-12-27 DIAGNOSIS — E1165 Type 2 diabetes mellitus with hyperglycemia: Principal | ICD-10-CM

## 2016-12-27 MED ORDER — METFORMIN HCL 500 MG PO TABS: 1000.0000 mg | ORAL_TABLET | Freq: Two times a day (BID) | ORAL | 0 refills | Status: AC

## 2016-12-28 ENCOUNTER — Ambulatory Visit: Payer: Managed Care, Other (non HMO) | Admitting: *Deleted

## 2017-01-04 DIAGNOSIS — Z0289 Encounter for other administrative examinations: Secondary | ICD-10-CM

## 2017-02-22 ENCOUNTER — Other Ambulatory Visit: Payer: Self-pay | Admitting: *Deleted

## 2017-02-22 DIAGNOSIS — IMO0001 Reserved for inherently not codable concepts without codable children: Secondary | ICD-10-CM

## 2017-02-22 DIAGNOSIS — Z794 Long term (current) use of insulin: Principal | ICD-10-CM

## 2017-02-22 DIAGNOSIS — E1165 Type 2 diabetes mellitus with hyperglycemia: Principal | ICD-10-CM

## 2017-02-22 MED ORDER — INSULIN DETEMIR 100 UNIT/ML FLEXPEN: 30.0000 [IU] | PEN_INJECTOR | Freq: Every day | SUBCUTANEOUS | 0 refills | Status: DC

## 2017-02-26 ENCOUNTER — Other Ambulatory Visit: Payer: Self-pay

## 2017-02-26 DIAGNOSIS — Z794 Long term (current) use of insulin: Principal | ICD-10-CM

## 2017-02-26 DIAGNOSIS — E1165 Type 2 diabetes mellitus with hyperglycemia: Principal | ICD-10-CM

## 2017-02-26 DIAGNOSIS — IMO0001 Reserved for inherently not codable concepts without codable children: Secondary | ICD-10-CM

## 2017-02-26 MED ORDER — INSULIN DETEMIR 100 UNIT/ML FLEXPEN: 30.0000 [IU] | PEN_INJECTOR | Freq: Every day | SUBCUTANEOUS | 0 refills | Status: AC

## 2017-04-09 ENCOUNTER — Other Ambulatory Visit: Payer: Self-pay | Admitting: *Deleted

## 2017-04-09 DIAGNOSIS — I10 Essential (primary) hypertension: Secondary | ICD-10-CM

## 2017-04-09 MED ORDER — LISINOPRIL 40 MG PO TABS
40.0000 mg | ORAL_TABLET | Freq: Every day | ORAL | 0 refills | Status: DC
Start: 2017-04-09 — End: 2017-12-19

## 2017-04-28 ENCOUNTER — Other Ambulatory Visit: Payer: Self-pay | Admitting: Nurse Practitioner

## 2017-04-28 DIAGNOSIS — I10 Essential (primary) hypertension: Secondary | ICD-10-CM

## 2017-04-29 ENCOUNTER — Other Ambulatory Visit: Payer: Self-pay | Admitting: Pediatrics

## 2017-12-16 ENCOUNTER — Other Ambulatory Visit: Payer: Self-pay | Admitting: Pediatrics

## 2017-12-16 DIAGNOSIS — E1165 Type 2 diabetes mellitus with hyperglycemia: Principal | ICD-10-CM

## 2017-12-16 DIAGNOSIS — Z794 Long term (current) use of insulin: Principal | ICD-10-CM

## 2017-12-16 DIAGNOSIS — IMO0001 Reserved for inherently not codable concepts without codable children: Secondary | ICD-10-CM

## 2017-12-16 NOTE — Telephone Encounter (Signed)
PT is calling to see if she can get an emergency supply of   insulin aspart (NOVOLOG FLEXPEN) 100 UNIT/ML FlexPen   She thinks this is name of it, states its short term.  She currently doesn't have insurance and is out of her insulin  [id number] [place], [place] - [address]

## 2017-12-16 NOTE — Telephone Encounter (Signed)
Pt has not worked or had insurance since January Started job 3 weeks ago, but not sure what her insurance will be or when it will go in effect allowing her to be able to be seen here again. Pt needs 'emergency' supply of her Novolog Flexpen till she is able to be seen  Last OV here was 12/19/16

## 2017-12-17 MED ORDER — INSULIN ASPART 100 UNIT/ML FLEXPEN: 5.0000 [IU] | PEN_INJECTOR | Freq: Three times a day (TID) | SUBCUTANEOUS | 0 refills | Status: AC

## 2017-12-17 NOTE — Telephone Encounter (Signed)
Patient aware and will call back to make an appt 

## 2017-12-18 ENCOUNTER — Other Ambulatory Visit: Payer: Self-pay | Admitting: Pediatrics

## 2017-12-18 DIAGNOSIS — E1165 Type 2 diabetes mellitus with hyperglycemia: Principal | ICD-10-CM

## 2017-12-18 DIAGNOSIS — IMO0001 Reserved for inherently not codable concepts without codable children: Secondary | ICD-10-CM

## 2017-12-18 DIAGNOSIS — Z794 Long term (current) use of insulin: Principal | ICD-10-CM

## 2017-12-19 NOTE — Telephone Encounter (Signed)
Generic lispro insulin kwikpen is $72 per pen. Vial is $180 but has more units and would last longer. If she wants I can send that in. She can look for coupons online with insulin companies or [email or web address]. Both those are also fast acting insulins similar to the novolog flexpen which is $134 per pen that was prescribed. Was she able to pick up the long acting insulin/levemir?  Getting medicine through patient assistance program of [facility] may work. Also she needs to see a doctor in follow up, if insurance is not coming through getting to either the health department or the free clinic below   [facility] [place], [employer]. [address] [place], [place] [address] Phone: [phone]   Let me know if she does want the lispro kwikpen sent in.

## 2017-12-19 NOTE — Telephone Encounter (Signed)
Pharmacy comment:  Alternative Requested:PT DOES NOT HAVE INS THIS IS VERY EXPENSIIVE. IS THERE SOMETHING CHEAPER WE CAN GET FOR HER.

## 2017-12-20 NOTE — Telephone Encounter (Signed)
[id number] 11/8

## 2018-06-26 ENCOUNTER — Telehealth: Payer: Self-pay | Admitting: Family Medicine
